# Patient Record
Sex: Female | Born: 1985
Health system: Southern US, Community
[De-identification: ages and names within clinical notes are randomized; demographics above are authoritative.]

## PROBLEM LIST (undated history)

## (undated) ENCOUNTER — Inpatient Hospital Stay (HOSPITAL_COMMUNITY): Payer: Self-pay

## (undated) DIAGNOSIS — O34219 Maternal care for unspecified type scar from previous cesarean delivery: Secondary | ICD-10-CM

## (undated) DIAGNOSIS — R51 Headache: Secondary | ICD-10-CM

## (undated) DIAGNOSIS — Z8619 Personal history of other infectious and parasitic diseases: Secondary | ICD-10-CM

## (undated) HISTORY — PX: OTHER SURGICAL HISTORY: SHX169

## (undated) HISTORY — DX: Personal history of other infectious and parasitic diseases: Z86.19

## (undated) HISTORY — PX: MOUTH SURGERY: SHX715

## (undated) HISTORY — DX: Headache: R51

---

## 2012-10-30 LAB — OB RESULTS CONSOLE RPR: RPR: NONREACTIVE

## 2012-10-30 LAB — OB RESULTS CONSOLE ABO/RH: RH Type: POSITIVE

## 2012-10-30 LAB — OB RESULTS CONSOLE HIV ANTIBODY (ROUTINE TESTING): HIV: NONREACTIVE

## 2012-10-30 LAB — OB RESULTS CONSOLE HEPATITIS B SURFACE ANTIGEN: Hepatitis B Surface Ag: NEGATIVE

## 2013-06-02 ENCOUNTER — Encounter (HOSPITAL_COMMUNITY): Payer: Self-pay | Admitting: *Deleted

## 2013-06-02 ENCOUNTER — Telehealth (HOSPITAL_COMMUNITY): Payer: Self-pay | Admitting: *Deleted

## 2013-06-02 NOTE — Telephone Encounter (Signed)
Preadmission screen  

## 2013-06-05 ENCOUNTER — Inpatient Hospital Stay (HOSPITAL_COMMUNITY): Admission: RE | Admit: 2013-06-05 | Payer: BC Managed Care – PPO | Source: Ambulatory Visit

## 2013-06-07 ENCOUNTER — Encounter (HOSPITAL_COMMUNITY): Payer: Self-pay | Admitting: Anesthesiology

## 2013-06-07 ENCOUNTER — Inpatient Hospital Stay (HOSPITAL_COMMUNITY): Payer: BC Managed Care – PPO | Admitting: Anesthesiology

## 2013-06-07 ENCOUNTER — Encounter (HOSPITAL_COMMUNITY): Payer: Self-pay

## 2013-06-07 ENCOUNTER — Inpatient Hospital Stay (HOSPITAL_COMMUNITY)
Admission: RE | Admit: 2013-06-07 | Discharge: 2013-06-10 | DRG: 371 | Disposition: A | Discharge status: Home / Self Care | Payer: BC Managed Care – PPO | Source: Ambulatory Visit | Attending: Obstetrics and Gynecology | Admitting: Obstetrics and Gynecology

## 2013-06-07 DIAGNOSIS — O99892 Other specified diseases and conditions complicating childbirth: Secondary | ICD-10-CM | POA: Diagnosis present

## 2013-06-07 DIAGNOSIS — O409XX0 Polyhydramnios, unspecified trimester, not applicable or unspecified: Secondary | ICD-10-CM | POA: Diagnosis present

## 2013-06-07 DIAGNOSIS — IMO0002 Reserved for concepts with insufficient information to code with codable children: Secondary | ICD-10-CM | POA: Diagnosis present

## 2013-06-07 DIAGNOSIS — O330 Maternal care for disproportion due to deformity of maternal pelvic bones: Secondary | ICD-10-CM | POA: Diagnosis not present

## 2013-06-07 DIAGNOSIS — O339 Maternal care for disproportion, unspecified: Secondary | ICD-10-CM | POA: Diagnosis not present

## 2013-06-07 DIAGNOSIS — O3660X0 Maternal care for excessive fetal growth, unspecified trimester, not applicable or unspecified: Secondary | ICD-10-CM | POA: Diagnosis present

## 2013-06-07 DIAGNOSIS — Z22330 Carrier of Group B streptococcus: Secondary | ICD-10-CM

## 2013-06-07 DIAGNOSIS — O34599 Maternal care for other abnormalities of gravid uterus, unspecified trimester: Secondary | ICD-10-CM | POA: Diagnosis present

## 2013-06-07 LAB — CBC
HCT: 42.3 % (ref 36.0–46.0)
HCT: 40.4 % (ref 36.0–46.0)
Hemoglobin: 14.6 g/dL (ref 12.0–15.0)
MCH: 31.9 pg (ref 26.0–34.0)
MCH: 32 pg (ref 26.0–34.0)
MCHC: 34.5 g/dL (ref 30.0–36.0)
MCV: 91.6 fL (ref 78.0–100.0)
Platelets: 172 10*3/uL (ref 150–400)
RDW: 13.7 % (ref 11.5–15.5)
RDW: 13.7 % (ref 11.5–15.5)

## 2013-06-07 LAB — COMPREHENSIVE METABOLIC PANEL
ALT: 9 U/L (ref 0–35)
BUN: 8 mg/dL (ref 6–23)
CO2: 21 mEq/L (ref 19–32)
Calcium: 9.6 mg/dL (ref 8.4–10.5)
Creatinine, Ser: 0.75 mg/dL (ref 0.50–1.10)
Glucose, Bld: 84 mg/dL (ref 70–99)

## 2013-06-07 LAB — RPR: RPR Ser Ql: NONREACTIVE

## 2013-06-07 MED ORDER — ONDANSETRON HCL 4 MG/2ML IJ SOLN
4.0000 mg | Freq: Once | INTRAMUSCULAR | Status: AC
  Administered 2013-06-07: 4 mg via INTRAVENOUS
  Filled 2013-06-07: qty 2

## 2013-06-07 MED ORDER — FAMOTIDINE IN NACL 20-0.9 MG/50ML-% IV SOLN
20.0000 mg | Freq: Once | INTRAVENOUS | Status: AC
  Administered 2013-06-08: 20 mg via INTRAVENOUS
  Filled 2013-06-07: qty 50

## 2013-06-07 MED ORDER — DIPHENHYDRAMINE HCL 50 MG/ML IJ SOLN: 12.5000 mg | INTRAMUSCULAR | Status: DC | PRN

## 2013-06-07 MED ORDER — MISOPROSTOL 25 MCG QUARTER TABLET
25.0000 ug | ORAL_TABLET | Freq: Once | ORAL | Status: AC
  Administered 2013-06-07: 25 ug via VAGINAL
  Filled 2013-06-07: qty 0.25

## 2013-06-07 MED ORDER — PENICILLIN G POTASSIUM 5000000 UNITS IJ SOLR
2.5000 10*6.[IU] | INTRAVENOUS | Status: DC
  Administered 2013-06-07: 2.5 10*6.[IU] via INTRAVENOUS
  Filled 2013-06-07 (×8): qty 2.5

## 2013-06-07 MED ORDER — FENTANYL 2.5 MCG/ML BUPIVACAINE 1/10 % EPIDURAL INFUSION (WH - ANES)
14.0000 mL/h | INTRAMUSCULAR | Status: DC | PRN
  Administered 2013-06-07: 14 mL/h via EPIDURAL
  Filled 2013-06-07: qty 125

## 2013-06-07 MED ORDER — LIDOCAINE HCL (PF) 1 % IJ SOLN: 30.0000 mL | INTRAMUSCULAR | Status: DC | PRN

## 2013-06-07 MED ORDER — FENTANYL CITRATE 0.05 MG/ML IJ SOLN
50.0000 ug | Freq: Once | INTRAMUSCULAR | Status: AC
  Administered 2013-06-07: 50 ug via INTRAVENOUS
  Filled 2013-06-07: qty 2

## 2013-06-07 MED ORDER — EPHEDRINE 5 MG/ML INJ
10.0000 mg | INTRAVENOUS | Status: DC | PRN
  Filled 2013-06-07: qty 4

## 2013-06-07 MED ORDER — LACTATED RINGERS IV SOLN: 500.0000 mL | Freq: Once | INTRAVENOUS | Status: DC

## 2013-06-07 MED ORDER — FENTANYL CITRATE 0.05 MG/ML IJ SOLN
INTRAMUSCULAR | Status: AC
  Filled 2013-06-07: qty 2

## 2013-06-07 MED ORDER — PHENYLEPHRINE 40 MCG/ML (10ML) SYRINGE FOR IV PUSH (FOR BLOOD PRESSURE SUPPORT): 80.0000 ug | PREFILLED_SYRINGE | INTRAVENOUS | Status: DC | PRN

## 2013-06-07 MED ORDER — EPHEDRINE 5 MG/ML INJ: 10.0000 mg | INTRAVENOUS | Status: DC | PRN

## 2013-06-07 MED ORDER — CITRIC ACID-SODIUM CITRATE 334-500 MG/5ML PO SOLN
30.0000 mL | ORAL | Status: DC | PRN
  Administered 2013-06-08 (×2): 30 mL via ORAL
  Filled 2013-06-07 (×2): qty 15

## 2013-06-07 MED ORDER — IBUPROFEN 600 MG PO TABS: 600.0000 mg | ORAL_TABLET | Freq: Four times a day (QID) | ORAL | Status: DC | PRN

## 2013-06-07 MED ORDER — PENICILLIN G POTASSIUM 5000000 UNITS IJ SOLR
5.0000 10*6.[IU] | Freq: Once | INTRAMUSCULAR | Status: AC
  Administered 2013-06-07: 5 10*6.[IU] via INTRAVENOUS
  Filled 2013-06-07: qty 5

## 2013-06-07 MED ORDER — LIDOCAINE HCL (PF) 1 % IJ SOLN
INTRAMUSCULAR | Status: DC | PRN
  Administered 2013-06-07 (×2): 5 mL

## 2013-06-07 MED ORDER — LACTATED RINGERS IV SOLN
500.0000 mL | INTRAVENOUS | Status: DC | PRN
  Administered 2013-06-07 (×2): 1000 mL via INTRAVENOUS

## 2013-06-07 MED ORDER — PHENYLEPHRINE 40 MCG/ML (10ML) SYRINGE FOR IV PUSH (FOR BLOOD PRESSURE SUPPORT)
80.0000 ug | PREFILLED_SYRINGE | INTRAVENOUS | Status: AC | PRN
  Administered 2013-06-08: 40 ug via INTRAVENOUS
  Administered 2013-06-08 (×2): 80 ug via INTRAVENOUS
  Administered 2013-06-08: 40 ug via INTRAVENOUS
  Administered 2013-06-08: 120 ug via INTRAVENOUS
  Administered 2013-06-08 (×3): 80 ug via INTRAVENOUS
  Filled 2013-06-07: qty 5

## 2013-06-07 MED ORDER — TERBUTALINE SULFATE 1 MG/ML IJ SOLN: 0.2500 mg | Freq: Once | INTRAMUSCULAR | Status: AC | PRN

## 2013-06-07 MED ORDER — FENTANYL CITRATE 0.05 MG/ML IJ SOLN
50.0000 ug | INTRAMUSCULAR | Status: DC | PRN
  Administered 2013-06-07 – 2013-06-08 (×2): 50 ug via INTRAVENOUS

## 2013-06-07 MED ORDER — OXYTOCIN 10 UNIT/ML IJ SOLN: 10.0000 [IU] | Freq: Once | INTRAMUSCULAR | Status: DC

## 2013-06-07 MED ORDER — OXYCODONE-ACETAMINOPHEN 5-325 MG PO TABS: 1.0000 | ORAL_TABLET | ORAL | Status: DC | PRN

## 2013-06-07 NOTE — Progress Notes (Signed)
Subjective: pain med wearing off, uncomfortable, breathing with ctx  Objective:   VS: Blood pressure 134/96, pulse 86, temperature 98.2 F (36.8 C), temperature source Oral, resp. rate 18, height 5\' 6"  (1.676 m), weight 93.895 kg (207 lb). FHR : baseline 140 / variability moderate / accelerations + /  Decelerations - Toco: contractions every 1-5 minutes Cervix : 5/80/-2 Membranes: bulging  Assessment:  Active first stage of labor FHR category I Polyhydramnios LGA +GBS Elevated BP's; r/o Preeclampsia  Plan:  Cervical bulb out Analgesia prn GBS prophylaxis CMP    Shannara Winbush, N MSN, CNM 06/07/2013, 7:06 PM

## 2013-06-07 NOTE — Progress Notes (Signed)
S:  Comfortable - no pain or pressure  O:  VS: Blood pressure 123/73, pulse 93, temperature 98.6 F (37 C), temperature source Oral, resp. rate 18, height 5\' 6"  (1.676 m), weight 93.895 kg (207 lb).        FHR : baseline 155 / variability moderate / accelerations + / variable deceleration occasional                   Single prolonged decels ~ an hour ago - provider not notified by staff        Toco: contractions every 1-3 minutes / moderate to strong / MVU adequate         Cervix : 7cm / 90% / -2 caput        Membranes: clear fluid with small show  A: active labor with slow progression and absent descent despite adequate ctx     FHR category 1-2  P: discussed with patient and spouse - no progress or descent      will recheck in 2 hours then consult for CS if no progression by that time      Plan CS if any further prolonged decelerations or the development of category 3 tracing       Dr Juliene Pina updated    Rose Ellis CNM, MSN, [facility] 06/07/2013, 10:57 PM

## 2013-06-07 NOTE — Progress Notes (Signed)
Report received  - care assumed  S:  Some pain with ctx but less since epidural       Water broke at 1900 while up to BR  O:  VS: Blood pressure 115/82, pulse 85, temperature 98.2 F (36.8 C), temperature source Oral, resp. rate 18, height 5\' 6"  (1.676 m), weight 93.895 kg (207 lb).        FHR : baseline 150 / variability moderate / accelerations none / occasional mild variable decelerations        Toco: contractions every 1-2 minutes / moderate to strong         Cervix : 7cm / 90% / vtx / -2        Membranes: SROM @ 1900 with clear fluid - large amount        IUPC placed / foley placed  A: Active labor     FHR category 1  P: active management - induction of labor      Recheck in 2 hours   Marlinda Mike CNM, MSN, [facility] 06/07/2013, 8:29 PM

## 2013-06-07 NOTE — Anesthesia Procedure Notes (Signed)

## 2013-06-07 NOTE — Anesthesia Preprocedure Evaluation (Signed)

## 2013-06-07 NOTE — Progress Notes (Signed)
Subjective:   Beginning to breath through ctx, some worse than others, requesting pain meds  Objective:   VS: Blood pressure 137/91, pulse 85, temperature 98.2 F (36.8 C), temperature source Oral, resp. rate 18, height 5\' 6"  (1.676 m), weight 93.895 kg (207 lb). FHR : baseline 135 / variability moderate / accelerations + / + decelerations (few variables) Toco: contractions every 1-5 minutes /  Cervix : 1.5/60/-3 Membranes: intact  Assessment:  First stage labor, latent FHR category II +GBS Polyhydramnios LGA  Plan:  Cervical bulb placed Cytotec PV Analgesia prn Reposition laterally    Huriel Matt, N MSN, CNM 06/07/2013, 5:18 PM

## 2013-06-07 NOTE — Progress Notes (Signed)
Subjective: starting to feel some ctx, not uncomfortable, declines pain meds  Objective:   VS: Blood pressure 131/80, pulse 90, temperature 97.5 F (36.4 C), temperature source Oral, resp. rate 18, height 5\' 6"  (1.676 m), weight 93.895 kg (207 lb). FHR : baseline 130 / variability moderate / accelerations + /  Decelerations - Toco: contractions every 2-4 minutes  Cervix : 1/60/-3, posterior, ballotable Membranes: intact  Assessment:  Latent first stage labor FHR category I Polyhydramnios LGA +GBS  Plan:  Next dose of Cytotec when ctx space out Foley bulb when lower station/more dilated GBS prophylaxis when more active labor     Donette Larry, N MSN, CNM 06/07/2013, 1:20 PM

## 2013-06-07 NOTE — H&P (Signed)
  OB ADMISSION/ HISTORY & PHYSICAL:  Admission Date: 06/07/2013  7:11 AM  Admit Diagnosis: Polyhydramnios, LGA  Rose Ellis is a 27 y.o. female presenting for IOL secondary to polyhydramnios 24.7, and LGA; 92%, EFW 8-15.  Prenatal History: G1P0   EDC : 06/08/2013, by Other Basis  Prenatal care at [facility]  Primary Ob Provider: Marlinda Mike Prenatal course complicated by polyhydramnios, LGA  Prenatal Labs: ABO, Rh:A POS Antibody: Negative (12/20 0000) Rubella: Immune (12/20 0000)  RPR: Nonreactive (12/20 0000)  HBsAg: Negative (12/20 0000)  HIV: Non-reactive (12/20 0000)  GBS:  POS 1 hr GTT : WNL  Medical / Surgical History :  Past medical history:  Past Medical History  Diagnosis Date  . Headache(784.0)   . Hx of varicella      Past surgical history:  Past Surgical History  Procedure Laterality Date  . Mouth surgery      x2  . Spleen laceration      2009    Family History:  Family History  Problem Relation Age of Onset  . Cancer Paternal Grandmother     lung  . Cancer Paternal Grandfather     lung  . Congenital heart disease Cousin   . Cancer Maternal Grandfather     lung     Social History:  reports that she has never smoked. She has never used smokeless tobacco. She reports that she does not drink alcohol or use illicit drugs.  Allergies: Robaxin   Current Medications at time of admission:  Prior to Admission medications   Medication Sig Start Date End Date Taking? Authorizing Provider  calcium carbonate (TUMS - DOSED IN MG ELEMENTAL CALCIUM) 500 MG chewable tablet Chew 1 tablet by mouth daily.   Yes Historical Provider, MD  Docosahexaenoic Acid (DHA PO) Take 1 tablet by mouth daily.   Yes Historical Provider, MD  EVENING PRIMROSE OIL PO 1 capsule by Other route 2 (two) times daily. Take 1 capsule in the morning and inserts 1 capsule vaginally at bedtime   Yes Historical Provider, MD  Prenatal Vit-Fe Fumarate-FA (PRENATAL  MULTIVITAMIN) TABS Take 1 tablet by mouth daily at 12 noon.   Yes Historical Provider, MD     Review of Systems: +FM, no LOF or VB, irregular mild ctx    Physical Exam:  VS: Blood pressure 136/90, pulse 123, temperature 98.3 F (36.8 C), temperature source Oral, resp. rate 18, height 5\' 6"  (1.676 m), weight 93.895 kg (207 lb).  General: alert and oriented, mildly anxious Heart: RRR Lungs: Clear lung fields Abdomen: Gravid, soft and non-tender, non-distended / uterus: gravid, EFW 9lbs Extremities: negative edema  Genitalia / VE: Dilation: 1 Effacement (%): 60 Station: -3 Exam by:: Mbhambri,CNM  FHR: baseline rate 140 / variability moderate / accelerations + / decelerations negative TOCO: 2-5 min  Assessment: 39.[redacted] weeks gestation Polyhydramnios LGA +GBS bateriuria First stage of labor FHR category I   Plan:  IOL w/ cytotec, foley bulb when more dilated GBS prophylaxis when 4cm Dr Seymour Bars notified of admission / plan of care   Donette Larry, Dorris Carnes MSN, CNM 06/07/2013, 9:08 AM

## 2013-06-08 ENCOUNTER — Inpatient Hospital Stay (HOSPITAL_COMMUNITY): Admission: AD | Admit: 2013-06-08 | Payer: Self-pay | Source: Ambulatory Visit | Admitting: Obstetrics and Gynecology

## 2013-06-08 ENCOUNTER — Encounter (HOSPITAL_COMMUNITY)
Admission: RE | Disposition: A | Discharge status: Home / Self Care | Payer: Self-pay | Source: Ambulatory Visit | Attending: Obstetrics and Gynecology

## 2013-06-08 ENCOUNTER — Encounter (HOSPITAL_COMMUNITY): Payer: Self-pay

## 2013-06-08 LAB — CBC
MCHC: 34 g/dL (ref 30.0–36.0)
RDW: 13.7 % (ref 11.5–15.5)

## 2013-06-08 SURGERY — Surgical Case
Anesthesia: Regional | Site: Abdomen | Wound class: Clean Contaminated

## 2013-06-08 SURGERY — Surgical Case
Anesthesia: Regional

## 2013-06-08 MED ORDER — PHENYLEPHRINE 40 MCG/ML (10ML) SYRINGE FOR IV PUSH (FOR BLOOD PRESSURE SUPPORT)
PREFILLED_SYRINGE | INTRAVENOUS | Status: AC
  Filled 2013-06-08: qty 10

## 2013-06-08 MED ORDER — ONDANSETRON HCL 4 MG PO TABS: 4.0000 mg | ORAL_TABLET | ORAL | Status: DC | PRN

## 2013-06-08 MED ORDER — DIBUCAINE 1 % RE OINT: 1.0000 "application " | TOPICAL_OINTMENT | RECTAL | Status: DC | PRN

## 2013-06-08 MED ORDER — MIDAZOLAM HCL 2 MG/2ML IJ SOLN: 0.5000 mg | Freq: Once | INTRAMUSCULAR | Status: DC | PRN

## 2013-06-08 MED ORDER — DIPHENHYDRAMINE HCL 25 MG PO CAPS: 25.0000 mg | ORAL_CAPSULE | Freq: Four times a day (QID) | ORAL | Status: DC | PRN

## 2013-06-08 MED ORDER — SIMETHICONE 80 MG PO CHEW
80.0000 mg | CHEWABLE_TABLET | ORAL | Status: DC | PRN
  Administered 2013-06-09: 80 mg via ORAL

## 2013-06-08 MED ORDER — LACTATED RINGERS IV SOLN
INTRAVENOUS | Status: DC
  Administered 2013-06-08 (×2): via INTRAVENOUS

## 2013-06-08 MED ORDER — OXYTOCIN 10 UNIT/ML IJ SOLN
INTRAMUSCULAR | Status: AC
  Filled 2013-06-08: qty 4

## 2013-06-08 MED ORDER — MEPERIDINE HCL 25 MG/ML IJ SOLN
INTRAMUSCULAR | Status: AC
  Filled 2013-06-08: qty 1

## 2013-06-08 MED ORDER — MORPHINE SULFATE (PF) 0.5 MG/ML IJ SOLN
INTRAMUSCULAR | Status: DC | PRN
  Administered 2013-06-08: 1 mg via EPIDURAL

## 2013-06-08 MED ORDER — NALOXONE HCL 0.4 MG/ML IJ SOLN: 0.4000 mg | INTRAMUSCULAR | Status: DC | PRN

## 2013-06-08 MED ORDER — METOCLOPRAMIDE HCL 5 MG/ML IJ SOLN
INTRAMUSCULAR | Status: AC
  Filled 2013-06-08: qty 2

## 2013-06-08 MED ORDER — LANOLIN HYDROUS EX OINT: 1.0000 "application " | TOPICAL_OINTMENT | CUTANEOUS | Status: DC | PRN

## 2013-06-08 MED ORDER — 0.9 % SODIUM CHLORIDE (POUR BTL) OPTIME
TOPICAL | Status: DC | PRN
  Administered 2013-06-08: 1000 mL

## 2013-06-08 MED ORDER — OXYTOCIN 10 UNIT/ML IJ SOLN
40.0000 [IU] | INTRAVENOUS | Status: DC | PRN
  Administered 2013-06-08: 40 [IU] via INTRAVENOUS

## 2013-06-08 MED ORDER — PROMETHAZINE HCL 25 MG/ML IJ SOLN: 6.2500 mg | INTRAMUSCULAR | Status: DC | PRN

## 2013-06-08 MED ORDER — NALBUPHINE HCL 10 MG/ML IJ SOLN
5.0000 mg | INTRAMUSCULAR | Status: DC | PRN
  Filled 2013-06-08: qty 1

## 2013-06-08 MED ORDER — SODIUM CHLORIDE 0.9 % IJ SOLN: 3.0000 mL | INTRAMUSCULAR | Status: DC | PRN

## 2013-06-08 MED ORDER — KETOROLAC TROMETHAMINE 30 MG/ML IJ SOLN
30.0000 mg | Freq: Four times a day (QID) | INTRAMUSCULAR | Status: DC | PRN
  Administered 2013-06-08: 30 mg via INTRAVENOUS
  Filled 2013-06-08: qty 1

## 2013-06-08 MED ORDER — DEXTROSE 5 % IV SOLN
1.0000 ug/kg/h | INTRAVENOUS | Status: DC | PRN
  Filled 2013-06-08: qty 2

## 2013-06-08 MED ORDER — DIPHENHYDRAMINE HCL 25 MG PO CAPS: 25.0000 mg | ORAL_CAPSULE | ORAL | Status: DC | PRN

## 2013-06-08 MED ORDER — MEPERIDINE HCL 25 MG/ML IJ SOLN: 6.2500 mg | INTRAMUSCULAR | Status: DC | PRN

## 2013-06-08 MED ORDER — KETOROLAC TROMETHAMINE 30 MG/ML IJ SOLN: 30.0000 mg | Freq: Four times a day (QID) | INTRAMUSCULAR | Status: DC | PRN

## 2013-06-08 MED ORDER — ONDANSETRON HCL 4 MG/2ML IJ SOLN: 4.0000 mg | Freq: Three times a day (TID) | INTRAMUSCULAR | Status: DC | PRN

## 2013-06-08 MED ORDER — SODIUM BICARBONATE 8.4 % IV SOLN
INTRAVENOUS | Status: DC | PRN
  Administered 2013-06-08: 6 mL via EPIDURAL

## 2013-06-08 MED ORDER — CEFAZOLIN SODIUM-DEXTROSE 2-3 GM-% IV SOLR
INTRAVENOUS | Status: DC | PRN
  Administered 2013-06-08: 2 g via INTRAVENOUS

## 2013-06-08 MED ORDER — OXYCODONE-ACETAMINOPHEN 5-325 MG PO TABS
1.0000 | ORAL_TABLET | ORAL | Status: DC | PRN
  Administered 2013-06-09 – 2013-06-10 (×7): 1 via ORAL
  Filled 2013-06-08 (×7): qty 1

## 2013-06-08 MED ORDER — METOCLOPRAMIDE HCL 5 MG/ML IJ SOLN: 10.0000 mg | Freq: Three times a day (TID) | INTRAMUSCULAR | Status: DC | PRN

## 2013-06-08 MED ORDER — MENTHOL 3 MG MT LOZG: 1.0000 | LOZENGE | OROMUCOSAL | Status: DC | PRN

## 2013-06-08 MED ORDER — DIPHENHYDRAMINE HCL 50 MG/ML IJ SOLN: 25.0000 mg | INTRAMUSCULAR | Status: DC | PRN

## 2013-06-08 MED ORDER — PRENATAL MULTIVITAMIN CH
1.0000 | ORAL_TABLET | Freq: Every day | ORAL | Status: DC
  Administered 2013-06-08 – 2013-06-10 (×3): 1 via ORAL
  Filled 2013-06-08 (×3): qty 1

## 2013-06-08 MED ORDER — LIDOCAINE-EPINEPHRINE 2 %-1:100000 IJ SOLN
INTRAMUSCULAR | Status: DC | PRN
  Administered 2013-06-08: 5 mL via INTRADERMAL

## 2013-06-08 MED ORDER — FENTANYL CITRATE 0.05 MG/ML IJ SOLN
INTRAMUSCULAR | Status: AC
  Filled 2013-06-08: qty 2

## 2013-06-08 MED ORDER — SIMETHICONE 80 MG PO CHEW
80.0000 mg | CHEWABLE_TABLET | Freq: Three times a day (TID) | ORAL | Status: DC
  Administered 2013-06-08 – 2013-06-10 (×7): 80 mg via ORAL

## 2013-06-08 MED ORDER — LACTATED RINGERS IV SOLN
INTRAVENOUS | Status: DC | PRN
  Administered 2013-06-08 (×3): via INTRAVENOUS

## 2013-06-08 MED ORDER — ONDANSETRON HCL 4 MG/2ML IJ SOLN
INTRAMUSCULAR | Status: DC | PRN
  Administered 2013-06-08: 4 mg via INTRAVENOUS

## 2013-06-08 MED ORDER — WITCH HAZEL-GLYCERIN EX PADS: 1.0000 "application " | MEDICATED_PAD | CUTANEOUS | Status: DC | PRN

## 2013-06-08 MED ORDER — SCOPOLAMINE 1 MG/3DAYS TD PT72
1.0000 | MEDICATED_PATCH | Freq: Once | TRANSDERMAL | Status: DC
  Administered 2013-06-08: 1.5 mg via TRANSDERMAL

## 2013-06-08 MED ORDER — DIPHENHYDRAMINE HCL 50 MG/ML IJ SOLN
INTRAMUSCULAR | Status: DC | PRN
  Administered 2013-06-08: 25 mg via INTRAVENOUS

## 2013-06-08 MED ORDER — OXYTOCIN 40 UNITS IN LACTATED RINGERS INFUSION - SIMPLE MED: 62.5000 mL/h | INTRAVENOUS | Status: AC

## 2013-06-08 MED ORDER — ONDANSETRON HCL 4 MG/2ML IJ SOLN
INTRAMUSCULAR | Status: AC
  Filled 2013-06-08: qty 2

## 2013-06-08 MED ORDER — TETANUS-DIPHTH-ACELL PERTUSSIS 5-2.5-18.5 LF-MCG/0.5 IM SUSP
0.5000 mL | Freq: Once | INTRAMUSCULAR | Status: AC
  Administered 2013-06-08: 0.5 mL via INTRAMUSCULAR

## 2013-06-08 MED ORDER — DIPHENHYDRAMINE HCL 50 MG/ML IJ SOLN: 12.5000 mg | INTRAMUSCULAR | Status: DC | PRN

## 2013-06-08 MED ORDER — CEFAZOLIN SODIUM-DEXTROSE 2-3 GM-% IV SOLR
2.0000 g | INTRAVENOUS | Status: AC
  Administered 2013-06-08: 2 g via INTRAVENOUS
  Filled 2013-06-08: qty 50

## 2013-06-08 MED ORDER — MORPHINE SULFATE 0.5 MG/ML IJ SOLN
INTRAMUSCULAR | Status: AC
  Filled 2013-06-08: qty 10

## 2013-06-08 MED ORDER — MORPHINE SULFATE (PF) 0.5 MG/ML IJ SOLN
INTRAMUSCULAR | Status: DC | PRN
  Administered 2013-06-08: 4 mg via EPIDURAL

## 2013-06-08 MED ORDER — ONDANSETRON HCL 4 MG/2ML IJ SOLN: 4.0000 mg | INTRAMUSCULAR | Status: DC | PRN

## 2013-06-08 MED ORDER — METOCLOPRAMIDE HCL 5 MG/ML IJ SOLN
INTRAMUSCULAR | Status: DC | PRN
  Administered 2013-06-08: 10 mg via INTRAVENOUS

## 2013-06-08 MED ORDER — SCOPOLAMINE 1 MG/3DAYS TD PT72
MEDICATED_PATCH | TRANSDERMAL | Status: AC
  Filled 2013-06-08: qty 1

## 2013-06-08 MED ORDER — ZOLPIDEM TARTRATE 5 MG PO TABS: 5.0000 mg | ORAL_TABLET | Freq: Every evening | ORAL | Status: DC | PRN

## 2013-06-08 MED ORDER — SENNOSIDES-DOCUSATE SODIUM 8.6-50 MG PO TABS
2.0000 | ORAL_TABLET | Freq: Every day | ORAL | Status: DC
  Administered 2013-06-09: 2 via ORAL

## 2013-06-08 MED ORDER — IBUPROFEN 600 MG PO TABS
600.0000 mg | ORAL_TABLET | Freq: Four times a day (QID) | ORAL | Status: DC
  Administered 2013-06-08 – 2013-06-10 (×8): 600 mg via ORAL
  Filled 2013-06-08 (×8): qty 1

## 2013-06-08 MED ORDER — MEPERIDINE HCL 25 MG/ML IJ SOLN
INTRAMUSCULAR | Status: DC | PRN
  Administered 2013-06-08 (×2): 12.5 mg via INTRAVENOUS

## 2013-06-08 MED ORDER — LACTATED RINGERS IV SOLN: INTRAVENOUS | Status: DC

## 2013-06-08 MED ORDER — KETOROLAC TROMETHAMINE 30 MG/ML IJ SOLN
INTRAMUSCULAR | Status: AC
  Administered 2013-06-08: 30 mg
  Filled 2013-06-08: qty 1

## 2013-06-08 MED ORDER — ACETAMINOPHEN 10 MG/ML IV SOLN
1000.0000 mg | Freq: Four times a day (QID) | INTRAVENOUS | Status: AC | PRN
  Filled 2013-06-08: qty 100

## 2013-06-08 MED ORDER — LACTATED RINGERS IV BOLUS (SEPSIS)
500.0000 mL | Freq: Once | INTRAVENOUS | Status: AC
  Administered 2013-06-08: 500 mL via INTRAVENOUS

## 2013-06-08 MED ORDER — FENTANYL CITRATE 0.05 MG/ML IJ SOLN
25.0000 ug | INTRAMUSCULAR | Status: DC | PRN
  Administered 2013-06-08: 50 ug via INTRAVENOUS

## 2013-06-08 MED ORDER — DIPHENHYDRAMINE HCL 50 MG/ML IJ SOLN
INTRAMUSCULAR | Status: AC
Start: 2013-06-08 — End: 2013-06-08
  Filled 2013-06-08: qty 1

## 2013-06-08 SURGICAL SUPPLY — 39 items
BENZOIN TINCTURE PRP APPL 2/3 (GAUZE/BANDAGES/DRESSINGS) ×2 IMPLANT
CLAMP CORD UMBIL (MISCELLANEOUS) IMPLANT
CLOTH BEACON ORANGE TIMEOUT ST (SAFETY) ×2 IMPLANT
CONTAINER PREFILL 10% NBF 15ML (MISCELLANEOUS) IMPLANT
DRAPE LG THREE QUARTER DISP (DRAPES) ×2 IMPLANT
DRSG OPSITE POSTOP 4X10 (GAUZE/BANDAGES/DRESSINGS) ×2 IMPLANT
DURAPREP 26ML APPLICATOR (WOUND CARE) ×2 IMPLANT
ELECT REM PT RETURN 9FT ADLT (ELECTROSURGICAL) ×2
ELECTRODE REM PT RTRN 9FT ADLT (ELECTROSURGICAL) ×1 IMPLANT
EXTRACTOR VACUUM KIWI (MISCELLANEOUS) IMPLANT
EXTRACTOR VACUUM M CUP 4 TUBE (SUCTIONS) IMPLANT
GLOVE BIO SURGEON STRL SZ7 (GLOVE) ×2 IMPLANT
GLOVE BIOGEL PI IND STRL 7.0 (GLOVE) ×1 IMPLANT
GLOVE BIOGEL PI INDICATOR 7.0 (GLOVE) ×1
GOWN STRL REIN XL XLG (GOWN DISPOSABLE) ×4 IMPLANT
KIT ABG SYR 3ML LUER SLIP (SYRINGE) IMPLANT
NEEDLE HYPO 25X5/8 SAFETYGLIDE (NEEDLE) IMPLANT
NS IRRIG 1000ML POUR BTL (IV SOLUTION) ×2 IMPLANT
PACK C SECTION WH (CUSTOM PROCEDURE TRAY) ×2 IMPLANT
PAD OB MATERNITY 4.3X12.25 (PERSONAL CARE ITEMS) ×2 IMPLANT
RTRCTR C-SECT PINK 25CM LRG (MISCELLANEOUS) IMPLANT
STAPLER VISISTAT 35W (STAPLE) IMPLANT
STRIP CLOSURE SKIN 1/2X4 (GAUZE/BANDAGES/DRESSINGS) ×2 IMPLANT
STRIP CLOSURE SKIN 1/4X4 (GAUZE/BANDAGES/DRESSINGS) IMPLANT
SUT PLAIN 0 NONE (SUTURE) IMPLANT
SUT PLAIN 2 0 (SUTURE)
SUT PLAIN 2 0 XLH (SUTURE) ×2 IMPLANT
SUT PLAIN ABS 2-0 CT1 27XMFL (SUTURE) IMPLANT
SUT VIC AB 0 CT1 27 (SUTURE) ×3
SUT VIC AB 0 CT1 27XBRD ANBCTR (SUTURE) ×3 IMPLANT
SUT VIC AB 0 CTX 36 (SUTURE) ×3
SUT VIC AB 0 CTX36XBRD ANBCTRL (SUTURE) ×3 IMPLANT
SUT VIC AB 2-0 CT1 27 (SUTURE) ×3
SUT VIC AB 2-0 CT1 TAPERPNT 27 (SUTURE) ×3 IMPLANT
SUT VIC AB 4-0 KS 27 (SUTURE) ×2 IMPLANT
SUT VICRYL 0 TIES 12 18 (SUTURE) IMPLANT
TOWEL OR 17X24 6PK STRL BLUE (TOWEL DISPOSABLE) ×6 IMPLANT
TRAY FOLEY CATH 14FR (SET/KITS/TRAYS/PACK) IMPLANT
WATER STERILE IRR 1000ML POUR (IV SOLUTION) ×2 IMPLANT

## 2013-06-08 NOTE — Progress Notes (Signed)
Patient ID: Rose Ellis, female   DOB: 08-Sep-1986, 27 y.o.   MRN: [id number] Pt seen and d/w CNM Fredric Mare. IOL for polyhydramnios, suspected LGA at 40 wks. Arrest of dilatation at 7 cm. VTX, FHT category I  Risks/complications of surgery reviewed incl infection, bleeding, damage to internal organs including bladder, bowels, ureters, blood vessels, other risks from anesthesia, VTE and delayed complications of any surgery, complications in future surgery reviewed. Also discussed neonatal complications incl difficult delivery, laceration, vacuum assistance, TTN etc. Pt understands and agrees, all concerns addressed.

## 2013-06-08 NOTE — Progress Notes (Signed)
S:  General malaise with nausea - given zofran and pepcid with minimal relief       states just feels exhausted and tired  O:  VS: Blood pressure 124/91, pulse 96, temperature 98.4 F (36.9 C), temperature source Oral, resp. rate 18, height 5\' 6"  (1.676 m), weight 93.895 kg (207 lb).        FHR : baseline 150 / variability moderate / accelerations +                     occasional variable decelerations with few episodic prolonged varaiables        Toco: contractions every 1-3 minutes / moderate to strong / MVU adequate        Cervix : edematous - thickened anterior cervix / swollen down to ~4cm                       vtx -2 with increased caput and over-riding sutures        Membranes: clear fluid with show  A: arrest of active labor - CPD etiology / LGA fetus     FHR category 1  P: consult with DR Juliene Pina for CS delivery      preop to OR for cesarean section      Discussed risks and benefits of CS at this time in labor due to CPD with adequate ctx      Patient and spouse understand - agree with plan for CS at this time   Marlinda Mike CNM, MSN, [facility] 06/08/2013, 1:09 AM

## 2013-06-08 NOTE — Anesthesia Postprocedure Evaluation (Signed)
  Anesthesia Post Note  Patient: Rose Ellis  Procedure(s) Performed: Procedure(s) (LRB): CESAREAN SECTION (N/A)  Anesthesia type: Epidural  Patient location: Mother/Baby  Post pain: Pain level controlled  Post assessment: Post-op Vital signs reviewed  Last Vitals:  Filed Vitals:   06/08/13 1340  BP: 105/71  Pulse: 105  Temp: 36.8 C  Resp: 20    Post vital signs: Reviewed  Level of consciousness: awake  Complications: No apparent anesthesia complications

## 2013-06-08 NOTE — Transfer of Care (Signed)
Immediate Anesthesia Transfer of Care Note  Patient: Rose Ellis  Procedure(s) Performed: Procedure(s): CESAREAN SECTION (N/A)  Patient Location: PACU  Anesthesia Type:Epidural  Level of Consciousness: awake, alert  and oriented  Airway & Oxygen Therapy: Patient Spontanous Breathing  Post-op Assessment: Report given to PACU RN and Post -op Vital signs reviewed and stable  Post vital signs: Reviewed and stable  Complications: No apparent anesthesia complications

## 2013-06-08 NOTE — Anesthesia Postprocedure Evaluation (Signed)
  Anesthesia Post Note  Patient: Rose Ellis  Procedure(s) Performed: Procedure(s) (LRB): CESAREAN SECTION (N/A)  Anesthesia type: Epidural  Patient location: PACU  Post pain: Pain level controlled  Post assessment: Post-op Vital signs reviewed  Last Vitals:  Filed Vitals:   06/08/13 0330  BP: 131/83  Pulse: 110  Temp:   Resp: 16    Post vital signs: Reviewed  Level of consciousness: awake  Complications: No apparent anesthesia complications

## 2013-06-08 NOTE — Op Note (Signed)
Cesarean Section Procedure Note [name]  06/08/2013  Indications: Arrest of labor at 7 cm after labor induction for polyhydramnios at 40 wks  Procedure: Primary Low Transverse Cesarean section                     Excision of right adnexal mass arising from right fallopian tube.  Pre-operative Diagnosis: Arrest of labor in stage I.   Post-operative Diagnosis: Same , right adnexal mass from fallopian tube  Surgeon:  Robley Fries, MD  Assistants: Marlinda Mike, CNM   Anesthesia: epidural   Procedure Details:  The patient was seen in the Labor Room. The risks, benefits, complications, treatment options, and expected outcomes were discussed with the patient. The patient concurred with the proposed plan, giving informed consent. identified as Rose Ellis and the procedure verified as C-Section Delivery. A Time Out was held and the above information confirmed.  2 gm Ancef given. After induction of anesthesia, the patient was draped and prepped in the usual sterile manner. A Pfannenstiel Incision was made and carried down through the subcutaneous tissue to the fascia. Fascial incision was made and extended transversely. The fascia was separated from the underlying rectus tissue superiorly and inferiorly. The peritoneum was identified and entered. Peritoneal incision was extended longitudinally. Alexis-O retractor was placed securely. The utero-vesical peritoneal reflection was incised transversely and the bladder flap was bluntly freed from the lower uterine segment. A low transverse uterine incision was made. Delivered from Cephalic LOT position was a vigorous FEMALE infant with Apgar scores of 9 at one minute and 9 at five minutes. Cord ph was not sent, the umbilical cord was clamped and cut cord blood was obtained for evaluation. The placenta was removed Intact and appeared normal. The uterine outline and ovaries appeared normal, left fallopian tube normal. A 5 cm pedunculated right adnexal  mass noted from right fimbria.. The uterine incision was closed with running locked sutures of 0 Vicryl followed by a second imbricating layer. Hemostasis was observed.  Right fallopian fimbrial mass was excised while carefully protecting the tube and hemostasis was observed.  Peritoneal closure done with 2-0 Vicryl. The fascia was then reapproximated with running sutures of 0Vicryl. The subcutaneous closure was performed using 2-0plain gut. The skin was closed with 4-0 Vicryl. Sterile dressing placed.    Instrument, sponge, and needle counts were correct prior the abdominal closure and were correct at the conclusion of the case.   Findings: FEMALE infant delivered cephalic from LOT position at 1.61 am on 06/08/13. Clear copious amniotic fluid noted.  3 vessel cord, nuchal cord x4 loops. Apgars 9 and 9 at and 5 minutes. Normal ovaries. Right fallopian tube fimbrial mass noted was excised. Left tube normal. Placenta normal.    Estimated Blood Loss: 800 cc  Total IV Fluids: 2400 cc LR  Urine Output: 150 cc clear in foley  Specimens: Right fimbrial mass, cord blood   Complications: no complications  Disposition: PACU - hemodynamically stable.   Maternal Condition: stable   Baby condition / location:  nursery-stable  Attending Attestation: I performed the procedure.   Signed: Surgeon(s): Robley Fries, MD

## 2013-06-09 NOTE — Progress Notes (Signed)
Patient ID: Rose Ellis, female   DOB: 1986-05-15, 27 y.o.   MRN: [id number] Subjective: POD# 27 Information for the patient's newborn:  Rose, Ellis [[id number]]  female  / circ yes  Reports feeling better since IV 27 d/c'd and able to walk around Feeding: breast Patient reports tolerating PO.  Breast symptoms: none Pain controlled with ibuprofen (OTC) and narcotic analgesics including Percocet Denies HA/SOB/C/P/N/V/dizziness. Flatus present. She reports vaginal bleeding as normal, without clots.  She is ambulating, urinating without difficult.     Objective:   VS:  Filed Vitals:   06/08/13 1745 06/08/13 2156 06/09/13 0200 06/09/13 0700  BP: 105/71 111/77 96/51 106/67  Pulse: 86 103 90 78  Temp: 98.1 F (36.7 C) 99.1 F (37.3 C) 98.1 F (36.7 C) 97.5 F (36.4 C)  TempSrc: Oral Oral Oral Oral  Resp: 16 18 18 18   Height:      Weight:      SpO2: 96% 96% 95%      Intake/Output Summary (Last 24 hours) at 06/09/13 1646 Last data filed at 06/09/13 0100  Gross per 24 hour  Intake 1199.58 ml  Output   1250 ml  Net -50.42 ml        Recent Labs  06/07/13 1930 06/08/13 0800  WBC 14.1* 20.3*  HGB 14.1 10.4*  HCT 40.4 30.6*  PLT 172 167     Blood type: A/Positive/-- (12/20 0000)  Rubella: Immune (12/20 0000)     Physical Exam:  General: alert, cooperative and no distress CV: Regular rate and rhythm, S1S2 present or without murmur or extra heart sounds Resp: clear Abdomen: soft, nontender, normal bowel sounds Incision: Tegaderm and Honeycomb dressing in place Uterine Fundus: firm, 1FB below umbilicus, nontender Lochia: minimal Ext: edema trace and Homans sign is negative, no sign of DVT      Assessment/Plan: 27 y.o.   POD# 1.  s/p Cesarean Delivery.  Indications: cephalopelvic disproportion and failure to progress                Principal Problem:   Postpartum care following cesarean delivery (7/29) Active Problems:   Polyhydramnios   Large for  gestational age fetus  Doing well, stable.               Regular diet as tolerated Ambulate Encouraged to increase hydration Routine post-op care  Kenard Gower, MSN, CNM 06/09/2013, 4:46 PM

## 2013-06-10 MED ORDER — OXYCODONE-ACETAMINOPHEN 5-325 MG PO TABS: 1.0000 | ORAL_TABLET | ORAL | Status: DC | PRN

## 2013-06-10 MED ORDER — IBUPROFEN 600 MG PO TABS: 600.0000 mg | ORAL_TABLET | Freq: Four times a day (QID) | ORAL | Status: DC

## 2013-06-10 NOTE — Discharge Summary (Signed)
POST OPERATIVE DISCHARGE SUMMARY:  Patient ID: Rose Ellis MRN: [id number] DOB/AGE: Feb 28, 1986 27 y.o.  Admit date: 06/07/2013 Admission Diagnoses: 39.[redacted] week gestation, polyhydramnios, LGA, GBS carrier   Discharge date: [id number]    Discharge Diagnoses: S/p Primary Cesarean Section  Prenatal history: G1P1001   EDC : 06/08/2013, by Other Basis  Prenatal care at [facility]  Primary provider : Marlinda Mike Prenatal course complicated by polyhydramnios, LGA  Prenatal Labs: ABO, Rh: A (12/20 0000)  Antibody: Negative (12/20 0000) Rubella: Immune (12/20 0000)  RPR: NON REACTIVE (07/28 0800)  HBsAg: Negative (12/20 0000)  HIV: Non-reactive (12/20 0000)  GBS: Positive (12/20 0000)  GTT: WNL  Medical / Surgical History :  Past medical history:  Past Medical History  Diagnosis Date  . Headache(784.0)   . Hx of varicella     Past surgical history:  Past Surgical History  Procedure Laterality Date  . Mouth surgery      x2  . Spleen laceration      2009  . Cesarean section N/A 06/08/2013    Procedure: CESAREAN SECTION;  Surgeon: Robley Fries, MD;  Location: [facility];  Service: Obstetrics;  Laterality: N/A;    Family History:  Family History  Problem Relation Age of Onset  . Cancer Paternal Grandmother     lung  . Cancer Paternal Grandfather     lung  . Congenital heart disease Cousin   . Cancer Maternal Grandfather     lung    Social History:  reports that she has never smoked. She has never used smokeless tobacco. She reports that she does not drink alcohol or use illicit drugs.   Allergies: Robaxin    Current Medications at time of admission:  Prior to Admission medications   Medication Sig Start Date End Date Taking? Authorizing Provider  Prenatal Vit-Fe Fumarate-FA (PRENATAL MULTIVITAMIN) TABS Take 1 tablet by mouth daily at 12 noon.   Yes Historical Provider, MD  ibuprofen (ADVIL,MOTRIN) 600 MG tablet Take 1 tablet (600 mg total) by  mouth every 6 (six) hours. 06/10/13   Lawernce Pitts, CNM  oxyCODONE-acetaminophen (PERCOCET/ROXICET) 5-325 MG per tablet Take 1-2 tablets by mouth every 4 (four) hours as needed. 06/10/13   Lawernce Pitts, CNM     Intrapartum Course: IOL w/ Cytotec and cervical balloon, epidural, arrest of dilation @7cm , primary CS   Procedures: Cesarean section delivery of female newborn by Dr Juliene Pina on 06/08/13 See operative report for further details APGAR (1 MIN): 9   APGAR (5 MINS): 9     Postoperative / postpartum course: uncomplicated Physical Exam:   VSS: Temp:  [98.1 F (36.7 C)] 98.1 F (36.7 C) (07/31 0540) Pulse Rate:  [77-90] 77 (07/31 0540) Resp:  [16-18] 16 (07/31 0540) BP: (100-103)/(52-69) 100/52 mmHg (07/31 0540)  LABS:  Recent Labs  06/07/13 1930 06/08/13 0800  WBC 14.1* 20.3*  HGB 14.1 10.4*  PLT 172 167    General: alert and oriented x3 Heart: RRR Lungs: clear  Abdomen: soft and non-tender / non-distended / active BS  Extremities: 1+ edema to bilateral lower extremeties / negative Homans  Dressing: clean, dry, and intact honeycomb dressing Incision:  approximated with subcuticular suture and steri strips / no erythema / no ecchymosis / no drainage  Discharge Instructions:  Discharged Condition: good Activity: pelvic rest and postoperative restrictions x 2 weeks Diet: routine Medications: see below   Medication List    STOP taking these medications  calcium carbonate 500 MG chewable tablet  Commonly known as:  TUMS - dosed in mg elemental calcium     DHA PO     EVENING PRIMROSE OIL PO      TAKE these medications       ibuprofen 600 MG tablet  Commonly known as:  ADVIL,MOTRIN  Take 1 tablet (600 mg total) by mouth every 6 (six) hours.     oxyCODONE-acetaminophen 5-325 MG per tablet  Commonly known as:  PERCOCET/ROXICET  Take 1-2 tablets by mouth every 4 (four) hours as needed.     prenatal multivitamin Tabs  Take 1 tablet by mouth daily  at 12 noon.       Wound Care: Keep dressing clean and dry, remove in 1-2 days Clean incision with warm water and dry thoroughly Call the office for redness, swelling, or drainage from incision  Postpartum Instructions: [facility] discharge booklet - instructions reviewed Discharge to: Home  Follow up : [facility] in 6 weeks for routine postpartum visit                      Signed: Donette Larry, N MSN, CNM 06/10/2013, 10:34 AM

## 2013-06-10 NOTE — Progress Notes (Signed)
POD # 2  Subjective: Pt reports feeling good/ Pain controlled with Ibuprofen and Percocet Tolerating po/Voiding without problems/ No n/v/Flatus present Activity: ad lib Bleeding is light Newborn info:  Information for the patient's newborn:  Jaryn, Hocutt [[id number]]  female  / Circumcision: done/ Feeding: breast   Objective: VS: BP 100/52  Pulse 77  Temp(Src) 98.1 F (36.7 C) (Oral)  Resp 16  Ht 5\' 6"  (1.676 m)  Wt 93.895 kg (207 lb)  BMI 33.43 kg/m2  SpO2 95%  I&O: Intake/Output     07/30 0701 - 07/31 0700 07/31 0701 - 08/01 0700   P.O.     I.V. (mL/kg)     Total Intake(mL/kg)     Urine (mL/kg/hr)     Total Output       Net              LABS:  Recent Labs  06/07/13 1930 06/08/13 0800  WBC 14.1* 20.3*  HGB 14.1 10.4*  PLT 172 167                           Physical Exam:  General: alert and cooperative CV: Regular rate and rhythm Resp: clear Abdomen: soft, nontender, normal bowel sounds Incision: healing well, no drainage, no erythema, no swelling Uterine Fundus: firm, below umbilicus, nontender Lochia: minimal Ext: edema 1+ to bilateral lower extremeties and Homans sign is negative, no sign of DVT    Assessment: POD # 2/ G1P1001/ S/P C/Section d/t arrest of dilation Doing well and stable for discharge home  Plan: Discharge home RX's: Ibuprofen 600mg  po Q 6 hrs prn pain #30 Refill x 1 Percocet 5/325 1 - 2 tabs po every 6 hrs prn pain  #30 No refill Follow-up in 6 weeks for postpartum check-up Remove dressing in 1-2 days   Signed: Donette Larry, N, MSN, CNM7/31/2014, 10:24 AM

## 2014-09-12 ENCOUNTER — Encounter (HOSPITAL_COMMUNITY): Payer: Self-pay

## 2015-11-12 HISTORY — DX: Maternal care for unspecified type scar from previous cesarean delivery: O34.219

## 2015-11-12 NOTE — L&D Delivery Note (Signed)
[facility] from [facility], RN reporting that patient is pushing well and baby crowning and ready for delivery  Delivery Note  First Stage: Labor onset: 0635 Augmentation : Pitocin - at complete dilation to facilitate vaginal delivery Analgesia /Anesthesia intrapartum: epidural AROM at 1230 / moderate meconium stained fluid  Second Stage: Complete dilation at 1337 Onset of pushing at 1820 FHR second stage 145  Delivery of a viable female at [id number] by CNM in OA position Tight double nuchal cord Cord double clamped after cessation of pulsation, cut by Matt - FOB Cord blood sample collected   Third Stage: Placenta delivered via Tomasa BlaseSchultz intact with 3 VC @ 1840 Placenta disposition: L&D Uterine tone firm / bleeding moderate  Deep 2nd degree sulcus, vaginal, and perineal / 1st degree RT labial lacerations identified  Anesthesia for repair: epidural Repair 2-0, 2-0, 3-0 vicryl Est. Blood Loss (mL): 400  Complications: VBAC / tight double nuchal  Mom to postpartum.  Baby to Couplet care / Skin to Skin.  Newborn: Birth Weight: 7 lbs 5.3 oz  Apgar Scores: 8/9 Feeding planned: breast  [name], [name], M  MSN, CNM 08/12/2016, 7:29 PM

## 2016-01-29 LAB — OB RESULTS CONSOLE GC/CHLAMYDIA
CHLAMYDIA, DNA PROBE: NEGATIVE
GC PROBE AMP, GENITAL: NEGATIVE

## 2016-01-29 LAB — OB RESULTS CONSOLE HIV ANTIBODY (ROUTINE TESTING): HIV: NONREACTIVE

## 2016-01-29 LAB — OB RESULTS CONSOLE HEPATITIS B SURFACE ANTIGEN: Hepatitis B Surface Ag: NEGATIVE

## 2016-01-29 LAB — OB RESULTS CONSOLE ABO/RH: RH Type: POSITIVE

## 2016-01-29 LAB — OB RESULTS CONSOLE ANTIBODY SCREEN: ANTIBODY SCREEN: NEGATIVE

## 2016-01-29 LAB — OB RESULTS CONSOLE RPR: RPR: NONREACTIVE

## 2016-01-29 LAB — OB RESULTS CONSOLE RUBELLA ANTIBODY, IGM: RUBELLA: IMMUNE

## 2016-02-13 DIAGNOSIS — Z36 Encounter for antenatal screening of mother: Secondary | ICD-10-CM | POA: Diagnosis not present

## 2016-02-13 DIAGNOSIS — Z3481 Encounter for supervision of other normal pregnancy, first trimester: Secondary | ICD-10-CM | POA: Diagnosis not present

## 2016-03-21 DIAGNOSIS — Z36 Encounter for antenatal screening of mother: Secondary | ICD-10-CM | POA: Diagnosis not present

## 2016-05-02 DIAGNOSIS — M62830 Muscle spasm of back: Secondary | ICD-10-CM | POA: Diagnosis not present

## 2016-05-02 DIAGNOSIS — M9903 Segmental and somatic dysfunction of lumbar region: Secondary | ICD-10-CM | POA: Diagnosis not present

## 2016-05-02 DIAGNOSIS — M9905 Segmental and somatic dysfunction of pelvic region: Secondary | ICD-10-CM | POA: Diagnosis not present

## 2016-05-02 DIAGNOSIS — M9904 Segmental and somatic dysfunction of sacral region: Secondary | ICD-10-CM | POA: Diagnosis not present

## 2016-05-30 DIAGNOSIS — Z36 Encounter for antenatal screening of mother: Secondary | ICD-10-CM | POA: Diagnosis not present

## 2016-07-26 DIAGNOSIS — Z36 Encounter for antenatal screening of mother: Secondary | ICD-10-CM | POA: Diagnosis not present

## 2016-07-26 LAB — OB RESULTS CONSOLE GBS: STREP GROUP B AG: NEGATIVE

## 2016-08-12 ENCOUNTER — Inpatient Hospital Stay (HOSPITAL_COMMUNITY)
Admission: AD | Admit: 2016-08-12 | Discharge: 2016-08-12 | Disposition: A | Discharge status: Home / Self Care | Payer: BLUE CROSS/BLUE SHIELD | Source: Ambulatory Visit | Attending: Obstetrics and Gynecology | Admitting: Obstetrics and Gynecology

## 2016-08-12 ENCOUNTER — Inpatient Hospital Stay (HOSPITAL_COMMUNITY): Payer: BLUE CROSS/BLUE SHIELD | Admitting: Anesthesiology

## 2016-08-12 ENCOUNTER — Encounter (HOSPITAL_COMMUNITY): Payer: Self-pay

## 2016-08-12 ENCOUNTER — Inpatient Hospital Stay (HOSPITAL_COMMUNITY)
Admission: AD | Admit: 2016-08-12 | Discharge: 2016-08-13 | DRG: 775 | Disposition: A | Discharge status: Home / Self Care | Payer: BLUE CROSS/BLUE SHIELD | Source: Ambulatory Visit | Attending: Obstetrics and Gynecology | Admitting: Obstetrics and Gynecology

## 2016-08-12 DIAGNOSIS — IMO0002 Reserved for concepts with insufficient information to code with codable children: Secondary | ICD-10-CM | POA: Diagnosis present

## 2016-08-12 DIAGNOSIS — Z3A38 38 weeks gestation of pregnancy: Secondary | ICD-10-CM | POA: Diagnosis not present

## 2016-08-12 DIAGNOSIS — Z23 Encounter for immunization: Secondary | ICD-10-CM | POA: Diagnosis not present

## 2016-08-12 DIAGNOSIS — O34219 Maternal care for unspecified type scar from previous cesarean delivery: Secondary | ICD-10-CM | POA: Diagnosis not present

## 2016-08-12 DIAGNOSIS — O34211 Maternal care for low transverse scar from previous cesarean delivery: Secondary | ICD-10-CM | POA: Diagnosis not present

## 2016-08-12 LAB — RPR: RPR Ser Ql: NONREACTIVE

## 2016-08-12 LAB — TYPE AND SCREEN
ABO/RH(D): A POS
ANTIBODY SCREEN: NEGATIVE

## 2016-08-12 LAB — CBC
HEMATOCRIT: 40.3 % (ref 36.0–46.0)
HEMOGLOBIN: 14 g/dL (ref 12.0–15.0)
MCH: 32.5 pg (ref 26.0–34.0)
MCHC: 34.7 g/dL (ref 30.0–36.0)
MCV: 93.5 fL (ref 78.0–100.0)
Platelets: 206 10*3/uL (ref 150–400)
RBC: 4.31 MIL/uL (ref 3.87–5.11)
RDW: 14.5 % (ref 11.5–15.5)
WBC: 15.3 10*3/uL — ABNORMAL HIGH (ref 4.0–10.5)

## 2016-08-12 LAB — ABO/RH: ABO/RH(D): A POS

## 2016-08-12 MED ORDER — COCONUT OIL OIL
1.0000 "application " | TOPICAL_OIL | Status: DC | PRN
  Administered 2016-08-13: 1 via TOPICAL
  Filled 2016-08-12: qty 120

## 2016-08-12 MED ORDER — OXYTOCIN 40 UNITS IN LACTATED RINGERS INFUSION - SIMPLE MED
1.0000 m[IU]/min | INTRAVENOUS | Status: DC
  Administered 2016-08-12: 2 m[IU]/min via INTRAVENOUS
  Filled 2016-08-12: qty 1000

## 2016-08-12 MED ORDER — BENZOCAINE-MENTHOL 20-0.5 % EX AERO
1.0000 "application " | INHALATION_SPRAY | CUTANEOUS | Status: DC | PRN
  Filled 2016-08-12 (×2): qty 56

## 2016-08-12 MED ORDER — ZOLPIDEM TARTRATE 5 MG PO TABS: 5.0000 mg | ORAL_TABLET | Freq: Every evening | ORAL | Status: DC | PRN

## 2016-08-12 MED ORDER — LIDOCAINE HCL (PF) 1 % IJ SOLN
INTRAMUSCULAR | Status: DC | PRN
  Administered 2016-08-12: 6 mL via EPIDURAL
  Administered 2016-08-12: 7 mL via EPIDURAL

## 2016-08-12 MED ORDER — EPHEDRINE 5 MG/ML INJ
10.0000 mg | INTRAVENOUS | Status: DC | PRN
  Filled 2016-08-12: qty 4

## 2016-08-12 MED ORDER — ONDANSETRON HCL 4 MG/2ML IJ SOLN: 4.0000 mg | INTRAMUSCULAR | Status: DC | PRN

## 2016-08-12 MED ORDER — IBUPROFEN 600 MG PO TABS
600.0000 mg | ORAL_TABLET | Freq: Four times a day (QID) | ORAL | Status: DC
  Administered 2016-08-12 – 2016-08-13 (×4): 600 mg via ORAL
  Filled 2016-08-12 (×4): qty 1

## 2016-08-12 MED ORDER — FENTANYL 2.5 MCG/ML BUPIVACAINE 1/10 % EPIDURAL INFUSION (WH - ANES)
14.0000 mL/h | INTRAMUSCULAR | Status: DC | PRN
  Administered 2016-08-12 (×2): 14 mL/h via EPIDURAL
  Filled 2016-08-12 (×2): qty 125

## 2016-08-12 MED ORDER — HYDROCORTISONE 2.5 % RE CREA
TOPICAL_CREAM | Freq: Three times a day (TID) | RECTAL | Status: DC
  Administered 2016-08-12 – 2016-08-13 (×3): via RECTAL
  Filled 2016-08-12: qty 28.35

## 2016-08-12 MED ORDER — SENNOSIDES-DOCUSATE SODIUM 8.6-50 MG PO TABS
2.0000 | ORAL_TABLET | ORAL | Status: DC
  Administered 2016-08-12: 2 via ORAL
  Filled 2016-08-12: qty 2

## 2016-08-12 MED ORDER — PHENYLEPHRINE 40 MCG/ML (10ML) SYRINGE FOR IV PUSH (FOR BLOOD PRESSURE SUPPORT)
80.0000 ug | PREFILLED_SYRINGE | INTRAVENOUS | Status: DC | PRN
  Filled 2016-08-12: qty 5
  Filled 2016-08-12: qty 10

## 2016-08-12 MED ORDER — TERBUTALINE SULFATE 1 MG/ML IJ SOLN
0.2500 mg | Freq: Once | INTRAMUSCULAR | Status: DC | PRN
  Filled 2016-08-12: qty 1

## 2016-08-12 MED ORDER — OXYTOCIN BOLUS FROM INFUSION
500.0000 mL | Freq: Once | INTRAVENOUS | Status: AC
  Administered 2016-08-12: 500 mL via INTRAVENOUS

## 2016-08-12 MED ORDER — ONDANSETRON HCL 4 MG/2ML IJ SOLN
4.0000 mg | Freq: Four times a day (QID) | INTRAMUSCULAR | Status: DC | PRN
  Administered 2016-08-12: 4 mg via INTRAVENOUS
  Filled 2016-08-12: qty 2

## 2016-08-12 MED ORDER — OXYCODONE HCL 5 MG PO TABS
5.0000 mg | ORAL_TABLET | ORAL | Status: DC | PRN
  Administered 2016-08-12 – 2016-08-13 (×5): 5 mg via ORAL
  Filled 2016-08-12 (×5): qty 1

## 2016-08-12 MED ORDER — LACTATED RINGERS IV SOLN
INTRAVENOUS | Status: DC
  Administered 2016-08-12 (×2): via INTRAVENOUS

## 2016-08-12 MED ORDER — PRENATAL MULTIVITAMIN CH
1.0000 | ORAL_TABLET | Freq: Every day | ORAL | Status: DC
  Administered 2016-08-13: 1 via ORAL
  Filled 2016-08-12: qty 1

## 2016-08-12 MED ORDER — LIDOCAINE HCL (PF) 1 % IJ SOLN
30.0000 mL | INTRAMUSCULAR | Status: DC | PRN
  Filled 2016-08-12: qty 30

## 2016-08-12 MED ORDER — LACTATED RINGERS IV SOLN
500.0000 mL | INTRAVENOUS | Status: DC | PRN
Start: 2016-08-12 — End: 2016-08-12

## 2016-08-12 MED ORDER — ACETAMINOPHEN 325 MG PO TABS: 650.0000 mg | ORAL_TABLET | ORAL | Status: DC | PRN

## 2016-08-12 MED ORDER — ACETAMINOPHEN 325 MG PO TABS
650.0000 mg | ORAL_TABLET | ORAL | Status: DC | PRN
  Administered 2016-08-13 (×2): 650 mg via ORAL
  Filled 2016-08-12 (×2): qty 2

## 2016-08-12 MED ORDER — WITCH HAZEL-GLYCERIN EX PADS: 1.0000 "application " | MEDICATED_PAD | CUTANEOUS | Status: DC | PRN

## 2016-08-12 MED ORDER — OXYTOCIN 10 UNIT/ML IJ SOLN: 10.0000 [IU] | Freq: Once | INTRAMUSCULAR | Status: DC

## 2016-08-12 MED ORDER — LACTATED RINGERS IV SOLN: 500.0000 mL | Freq: Once | INTRAVENOUS | Status: DC

## 2016-08-12 MED ORDER — PHENYLEPHRINE 40 MCG/ML (10ML) SYRINGE FOR IV PUSH (FOR BLOOD PRESSURE SUPPORT)
80.0000 ug | PREFILLED_SYRINGE | INTRAVENOUS | Status: DC | PRN
  Filled 2016-08-12: qty 5

## 2016-08-12 MED ORDER — DIPHENHYDRAMINE HCL 25 MG PO CAPS: 25.0000 mg | ORAL_CAPSULE | Freq: Four times a day (QID) | ORAL | Status: DC | PRN

## 2016-08-12 MED ORDER — OXYCODONE HCL 5 MG PO TABS: 10.0000 mg | ORAL_TABLET | ORAL | Status: DC | PRN

## 2016-08-12 MED ORDER — DIPHENHYDRAMINE HCL 50 MG/ML IJ SOLN: 12.5000 mg | INTRAMUSCULAR | Status: DC | PRN

## 2016-08-12 MED ORDER — SOD CITRATE-CITRIC ACID 500-334 MG/5ML PO SOLN: 30.0000 mL | ORAL | Status: DC | PRN

## 2016-08-12 MED ORDER — TETANUS-DIPHTH-ACELL PERTUSSIS 5-2.5-18.5 LF-MCG/0.5 IM SUSP
0.5000 mL | Freq: Once | INTRAMUSCULAR | Status: AC
  Administered 2016-08-13: 0.5 mL via INTRAMUSCULAR
  Filled 2016-08-12: qty 0.5

## 2016-08-12 MED ORDER — OXYTOCIN 40 UNITS IN LACTATED RINGERS INFUSION - SIMPLE MED: 2.5000 [IU]/h | INTRAVENOUS | Status: DC

## 2016-08-12 MED ORDER — SIMETHICONE 80 MG PO CHEW: 80.0000 mg | CHEWABLE_TABLET | ORAL | Status: DC | PRN

## 2016-08-12 MED ORDER — DIBUCAINE 1 % RE OINT: 1.0000 "application " | TOPICAL_OINTMENT | RECTAL | Status: DC | PRN

## 2016-08-12 MED ORDER — ONDANSETRON HCL 4 MG PO TABS: 4.0000 mg | ORAL_TABLET | ORAL | Status: DC | PRN

## 2016-08-12 NOTE — Discharge Instructions (Signed)
Third Trimester of Pregnancy °The third trimester is from week 29 through week 42, months 7 through 9. The third trimester is a time when the fetus is growing rapidly. At the end of the ninth month, the fetus is about 20 inches in length and weighs 6-10 pounds.  °BODY CHANGES °Your body goes through many changes during pregnancy. The changes vary from woman to woman.  °· Your weight will continue to increase. You can expect to gain 25-35 pounds (11-16 kg) by the end of the pregnancy. °· You may begin to get stretch marks on your hips, abdomen, and breasts. °· You may urinate more often because the fetus is moving lower into your pelvis and pressing on your bladder. °· You may develop or continue to have heartburn as a result of your pregnancy. °· You may develop constipation because certain hormones are causing the muscles that push waste through your intestines to slow down. °· You may develop hemorrhoids or swollen, bulging veins (varicose veins). °· You may have pelvic pain because of the weight gain and pregnancy hormones relaxing your joints between the bones in your pelvis. Backaches may result from overexertion of the muscles supporting your posture. °· You may have changes in your hair. These can include thickening of your hair, rapid growth, and changes in texture. Some women also have hair loss during or after pregnancy, or hair that feels dry or thin. Your hair will most likely return to normal after your baby is born. °· Your breasts will continue to grow and be tender. A yellow discharge may leak from your breasts called colostrum. °· Your belly button may stick out. °· You may feel short of breath because of your expanding uterus. °· You may notice the fetus "dropping," or moving lower in your abdomen. °· You may have a bloody mucus discharge. This usually occurs a few days to a week before labor begins. °· Your cervix becomes thin and soft (effaced) near your due date. °WHAT TO EXPECT AT YOUR PRENATAL  EXAMS  °You will have prenatal exams every 2 weeks until week 36. Then, you will have weekly prenatal exams. During a routine prenatal visit: °· You will be weighed to make sure you and the fetus are growing normally. °· Your blood pressure is taken. °· Your abdomen will be measured to track your baby's growth. °· The fetal heartbeat will be listened to. °· Any test results from the previous visit will be discussed. °· You may have a cervical check near your due date to see if you have effaced. °At around 36 weeks, your caregiver will check your cervix. At the same time, your caregiver will also perform a test on the secretions of the vaginal tissue. This test is to determine if a type of bacteria, Group B streptococcus, is present. Your caregiver will explain this further. °Your caregiver may ask you: °· What your birth plan is. °· How you are feeling. °· If you are feeling the baby move. °· If you have had any abnormal symptoms, such as leaking fluid, bleeding, severe headaches, or abdominal cramping. °· If you are using any tobacco products, including cigarettes, chewing tobacco, and electronic cigarettes. °· If you have any questions. °Other tests or screenings that may be performed during your third trimester include: °· Blood tests that check for low iron levels (anemia). °· Fetal testing to check the health, activity level, and growth of the fetus. Testing is done if you have certain medical conditions or if   there are problems during the pregnancy. °· HIV (human immunodeficiency virus) testing. If you are at high risk, you may be screened for HIV during your third trimester of pregnancy. °FALSE LABOR °You may feel small, irregular contractions that eventually go away. These are called Braxton Hicks contractions, or false labor. Contractions may last for hours, days, or even weeks before true labor sets in. If contractions come at regular intervals, intensify, or become painful, it is best to be seen by your  caregiver.  °SIGNS OF LABOR  °· Menstrual-like cramps. °· Contractions that are 5 minutes apart or less. °· Contractions that start on the top of the uterus and spread down to the lower abdomen and back. °· A sense of increased pelvic pressure or back pain. °· A watery or bloody mucus discharge that comes from the vagina. °If you have any of these signs before the 37th week of pregnancy, call your caregiver right away. You need to go to the hospital to get checked immediately. °HOME CARE INSTRUCTIONS  °· Avoid all smoking, herbs, alcohol, and unprescribed drugs. These chemicals affect the formation and growth of the baby. °· Do not use any tobacco products, including cigarettes, chewing tobacco, and electronic cigarettes. If you need help quitting, ask your health care provider. You may receive counseling support and other resources to help you quit. °· Follow your caregiver's instructions regarding medicine use. There are medicines that are either safe or unsafe to take during pregnancy. °· Exercise only as directed by your caregiver. Experiencing uterine cramps is a good sign to stop exercising. °· Continue to eat regular, healthy meals. °· Wear a good support bra for breast tenderness. °· Do not use hot tubs, steam rooms, or saunas. °· Wear your seat belt at all times when driving. °· Avoid raw meat, uncooked cheese, cat litter boxes, and soil used by cats. These carry germs that can cause birth defects in the baby. °· Take your prenatal vitamins. °· Take 1500-2000 mg of calcium daily starting at the 20th week of pregnancy until you deliver your baby. °· Try taking a stool softener (if your caregiver approves) if you develop constipation. Eat more high-fiber foods, such as fresh vegetables or fruit and whole grains. Drink plenty of fluids to keep your urine clear or pale yellow. °· Take warm sitz baths to soothe any pain or discomfort caused by hemorrhoids. Use hemorrhoid cream if your caregiver approves. °· If  you develop varicose veins, wear support hose. Elevate your feet for 15 minutes, 3-4 times a day. Limit salt in your diet. °· Avoid heavy lifting, wear low heal shoes, and practice good posture. °· Rest a lot with your legs elevated if you have leg cramps or low back pain. °· Visit your dentist if you have not gone during your pregnancy. Use a soft toothbrush to brush your teeth and be gentle when you floss. °· A sexual relationship may be continued unless your caregiver directs you otherwise. °· Do not travel far distances unless it is absolutely necessary and only with the approval of your caregiver. °· Take prenatal classes to understand, practice, and ask questions about the labor and delivery. °· Make a trial run to the hospital. °· Pack your hospital bag. °· Prepare the baby's nursery. °· Continue to go to all your prenatal visits as directed by your caregiver. °SEEK MEDICAL CARE IF: °· You are unsure if you are in labor or if your water has broken. °· You have dizziness. °· You have   mild pelvic cramps, pelvic pressure, or nagging pain in your abdominal area. °· You have persistent nausea, vomiting, or diarrhea. °· You have a bad smelling vaginal discharge. °· You have pain with urination. °SEEK IMMEDIATE MEDICAL CARE IF:  °· You have a fever. °· You are leaking fluid from your vagina. °· You have spotting or bleeding from your vagina. °· You have severe abdominal cramping or pain. °· You have rapid weight loss or gain. °· You have shortness of breath with chest pain. °· You notice sudden or extreme swelling of your face, hands, ankles, feet, or legs. °· You have not felt your baby move in over an hour. °· You have severe headaches that do not go away with medicine. °· You have vision changes. °  °This information is not intended to replace advice given to you by your health care provider. Make sure you discuss any questions you have with your health care provider. °  °Document Released: 10/22/2001 Document  Revised: 11/18/2014 Document Reviewed: 12/29/2012 °[employer] Interactive Patient Education ©2016 [employer] °Fetal Movement Counts °Patient Name: __________________________________________________ Patient Due Date: ____________________ °Performing a fetal movement count is highly recommended in high-risk pregnancies, but it is good for every pregnant woman to do. Your health care provider may ask you to start counting fetal movements at 28 weeks of the pregnancy. Fetal movements often increase: °· After eating a full meal. °· After physical activity. °· After eating or drinking something sweet or cold. °· At rest. °Pay attention to when you feel the baby is most active. This will help you notice a pattern of your baby's sleep and wake cycles and what factors contribute to an increase in fetal movement. It is important to perform a fetal movement count at the same time each day when your baby is normally most active.  °HOW TO COUNT FETAL MOVEMENTS °1. Find a quiet and comfortable area to sit or lie down on your left side. Lying on your left side provides the best blood and oxygen circulation to your baby. °2. Write down the day and time on a sheet of paper or in a journal. °3. Start counting kicks, flutters, swishes, rolls, or jabs in a 2-hour period. You should feel at least 10 movements within 2 hours. °4. If you do not feel 10 movements in 2 hours, wait 2-3 hours and count again. Look for a change in the pattern or not enough counts in 2 hours. °SEEK MEDICAL CARE IF: °· You feel less than 10 counts in 2 hours, tried twice. °· There is no movement in over an hour. °· The pattern is changing or taking longer each day to reach 10 counts in 2 hours. °· You feel the baby is not moving as he or she usually does. °Date: ____________ Movements: ____________ Start time: ____________ Finish time: ____________  °Date: ____________ Movements: ____________ Start time: ____________ Finish time: ____________ °Date:  ____________ Movements: ____________ Start time: ____________ Finish time: ____________ °Date: ____________ Movements: ____________ Start time: ____________ Finish time: ____________ °Date: ____________ Movements: ____________ Start time: ____________ Finish time: ____________ °Date: ____________ Movements: ____________ Start time: ____________ Finish time: ____________ °Date: ____________ Movements: ____________ Start time: ____________ Finish time: ____________ °Date: ____________ Movements: ____________ Start time: ____________ Finish time: ____________  °Date: ____________ Movements: ____________ Start time: ____________ Finish time: ____________ °Date: ____________ Movements: ____________ Start time: ____________ Finish time: ____________ °Date: ____________ Movements: ____________ Start time: ____________ Finish time: ____________ °Date: ____________ Movements: ____________ Start time: ____________ Finish time: ____________ °Date:   ____________ Movements: ____________ Start time: ____________ Finish time: ____________ °Date: ____________ Movements: ____________ Start time: ____________ Finish time: ____________ °Date: ____________ Movements: ____________ Start time: ____________ Finish time: ____________  °Date: ____________ Movements: ____________ Start time: ____________ Finish time: ____________ °Date: ____________ Movements: ____________ Start time: ____________ Finish time: ____________ °Date: ____________ Movements: ____________ Start time: ____________ Finish time: ____________ °Date: ____________ Movements: ____________ Start time: ____________ Finish time: ____________ °Date: ____________ Movements: ____________ Start time: ____________ Finish time: ____________ °Date: ____________ Movements: ____________ Start time: ____________ Finish time: ____________ °Date: ____________ Movements: ____________ Start time: ____________ Finish time: ____________  °Date: ____________ Movements: ____________ Start  time: ____________ Finish time: ____________ °Date: ____________ Movements: ____________ Start time: ____________ Finish time: ____________ °Date: ____________ Movements: ____________ Start time: ____________ Finish time: ____________ °Date: ____________ Movements: ____________ Start time: ____________ Finish time: ____________ °Date: ____________ Movements: ____________ Start time: ____________ Finish time: ____________ °Date: ____________ Movements: ____________ Start time: ____________ Finish time: ____________ °Date: ____________ Movements: ____________ Start time: ____________ Finish time: ____________  °Date: ____________ Movements: ____________ Start time: ____________ Finish time: ____________ °Date: ____________ Movements: ____________ Start time: ____________ Finish time: ____________ °Date: ____________ Movements: ____________ Start time: ____________ Finish time: ____________ °Date: ____________ Movements: ____________ Start time: ____________ Finish time: ____________ °Date: ____________ Movements: ____________ Start time: ____________ Finish time: ____________ °Date: ____________ Movements: ____________ Start time: ____________ Finish time: ____________ °Date: ____________ Movements: ____________ Start time: ____________ Finish time: ____________  °Date: ____________ Movements: ____________ Start time: ____________ Finish time: ____________ °Date: ____________ Movements: ____________ Start time: ____________ Finish time: ____________ °Date: ____________ Movements: ____________ Start time: ____________ Finish time: ____________ °Date: ____________ Movements: ____________ Start time: ____________ Finish time: ____________ °Date: ____________ Movements: ____________ Start time: ____________ Finish time: ____________ °Date: ____________ Movements: ____________ Start time: ____________ Finish time: ____________ °Date: ____________ Movements: ____________ Start time: ____________ Finish time: ____________    °Date: ____________ Movements: ____________ Start time: ____________ Finish time: ____________ °Date: ____________ Movements: ____________ Start time: ____________ Finish time: ____________ °Date: ____________ Movements: ____________ Start time: ____________ Finish time: ____________ °Date: ____________ Movements: ____________ Start time: ____________ Finish time: ____________ °Date: ____________ Movements: ____________ Start time: ____________ Finish time: ____________ °Date: ____________ Movements: ____________ Start time: ____________ Finish time: ____________ °Date: ____________ Movements: ____________ Start time: ____________ Finish time: ____________  °Date: ____________ Movements: ____________ Start time: ____________ Finish time: ____________ °Date: ____________ Movements: ____________ Start time: ____________ Finish time: ____________ °Date: ____________ Movements: ____________ Start time: ____________ Finish time: ____________ °Date: ____________ Movements: ____________ Start time: ____________ Finish time: ____________ °Date: ____________ Movements: ____________ Start time: ____________ Finish time: ____________ °Date: ____________ Movements: ____________ Start time: ____________ Finish time: ____________ °  °This information is not intended to replace advice given to you by your health care provider. Make sure you discuss any questions you have with your health care provider. °  °Document Released: 11/27/2006 Document Revised: 11/18/2014 Document Reviewed: 08/24/2012 °[employer] Interactive Patient Education ©2016 [employer] °Braxton Hicks Contractions °Contractions of the uterus can occur throughout pregnancy. Contractions are not always a sign that you are in labor.  °WHAT ARE BRAXTON HICKS CONTRACTIONS?  °Contractions that occur before labor are called Braxton Hicks contractions, or false labor. Toward the end of pregnancy (32-34 weeks), these contractions can develop more often and may become more  forceful. This is not true labor because these contractions do not result in opening (dilatation) and thinning of the cervix. They are sometimes difficult to tell apart from true labor because these contractions can be forceful and people have different pain tolerances. You   should not feel embarrassed if you go to the hospital with false labor. Sometimes, the only way to tell if you are in true labor is for your health care provider to look for changes in the cervix. °If there are no prenatal problems or other health problems associated with the pregnancy, it is completely safe to be sent home with false labor and await the onset of true labor. °HOW CAN YOU TELL THE DIFFERENCE BETWEEN TRUE AND FALSE LABOR? °False Labor °· The contractions of false labor are usually shorter and not as hard as those of true labor.   °· The contractions are usually irregular.   °· The contractions are often felt in the front of the lower abdomen and in the groin.   °· The contractions may go away when you walk around or change positions while lying down.   °· The contractions get weaker and are shorter lasting as time goes on.   °· The contractions do not usually become progressively stronger, regular, and closer together as with true labor.   °True Labor °· Contractions in true labor last 30-70 seconds, become very regular, usually become more intense, and increase in frequency.   °· The contractions do not go away with walking.   °· The discomfort is usually felt in the top of the uterus and spreads to the lower abdomen and low back.   °· True labor can be determined by your health care provider with an exam. This will show that the cervix is dilating and getting thinner.   °WHAT TO REMEMBER °· Keep up with your usual exercises and follow other instructions given by your health care provider.   °· Take medicines as directed by your health care provider.   °· Keep your regular prenatal appointments.   °· Eat and drink lightly if you  think you are going into labor.   °· If Braxton Hicks contractions are making you uncomfortable:   °¨ Change your position from lying down or resting to walking, or from walking to resting.   °¨ Sit and rest in a tub of warm water.   °¨ Drink 2-3 glasses of water. Dehydration may cause these contractions.   °¨ Do slow and deep breathing several times an hour.   °WHEN SHOULD I SEEK IMMEDIATE MEDICAL CARE? °Seek immediate medical care if: °· Your contractions become stronger, more regular, and closer together.   °· You have fluid leaking or gushing from your vagina.   °· You have a fever.   °· You pass blood-tinged mucus.   °· You have vaginal bleeding.   °· You have continuous abdominal pain.   °· You have low back pain that you never had before.   °· You feel your baby's head pushing down and causing pelvic pressure.   °· Your baby is not moving as much as it used to.   °  °This information is not intended to replace advice given to you by your health care provider. Make sure you discuss any questions you have with your health care provider. °  °Document Released: 10/28/2005 Document Revised: 11/02/2013 Document Reviewed: 08/09/2013 °[employer]vier Interactive Patient Education ©2016 [employer] ° °

## 2016-08-12 NOTE — Anesthesia Procedure Notes (Signed)
Epidural Patient location during procedure: OB Start time: 08/12/2016 11:42 AM End time: 08/12/2016 11:46 AM  Staffing Anesthesiologist: Leilani AbleHATCHETT, Jenissa Tyrell Performed: anesthesiologist   Preanesthetic Checklist Completed: patient identified, surgical consent, pre-op evaluation, timeout performed, IV checked, risks and benefits discussed and monitors and equipment checked  Epidural Patient position: sitting Prep: site prepped and draped and DuraPrep Patient monitoring: continuous pulse ox and blood pressure Approach: midline Location: L3-L4 Injection technique: LOR air  Needle:  Needle type: Tuohy  Needle gauge: 17 G Needle length: 9 cm and 9 Needle insertion depth: 5 cm cm Catheter type: closed end flexible Catheter size: 19 Gauge Catheter at skin depth: 10 cm Test dose: negative and Other  Assessment Sensory level: T9 Events: blood not aspirated, injection not painful, no injection resistance, negative IV test and no paresthesia  Additional Notes Reason for block:procedure for pain

## 2016-08-12 NOTE — Progress Notes (Addendum)
Patient ID: Rose Ellis, female   DOB: 1986/01/04, 30 y.o.   MRN: [id number] Subjective: Rose Gunthershley N Stopka is a 30 y.o. G2P1001 at [redacted]w[redacted]d by ultrasound admitted for active labor for TOLAC. Very uncomfortable with contractions. Standing at bedside rocking.  Requesting epidural, if enough cervical dilation.  Objective: Vitals:   08/12/16 0645 08/12/16 0840 08/12/16 0843 08/12/16 0844  BP:   132/85   Pulse:   (!) 105   Resp:  20    Temp: 98.4 F (36.9 C) 98.4 F (36.9 C)    TempSrc:  Oral    Weight: 88.9 kg (196 lb)   88.9 kg (196 lb)  Height: 5\' 7"  (1.702 m)   5\' 7"  (1.702 m)    No intake/output data recorded. No intake/output data recorded.   FHT:  FHR: 130 bpm, variability: moderate,  accelerations:  Present,  decelerations:  Absent UC:   regular, every 4 minutes SVE:    5.5/100/-2/vtx BBOW  Labs:   Recent Labs  08/12/16 0727  WBC 15.3*  HGB 14.0  HCT 40.3  PLT 206    Assessment / Plan: Spontaneous labor, progressing normally  Labor: Progressing normally Preeclampsia:  N/A Fetal Wellbeing:  Category I Pain Control:  Labor support without medications  Plan AROM after comfortable with epidural IV bolus started Anticipated MOD:  NSVD  *Dr. Billy Coastaavon updated on labor progress and POC  Jenae Tomasello, M, MSN, CNM 08/12/2016, 10:24 AM

## 2016-08-12 NOTE — H&P (Signed)
OB ADMISSION/ HISTORY & PHYSICAL:  Admission Date: 08/12/2016  6:27 AM  Admit Diagnosis: Term pregnancy, previous cesarean section, for trial of labor, early labor  Rose Ellis is a 30 y.o. female presenting for  Ctx, started at 2230 last night, more intense since 0200. Seen in MAU at 0330, cvx was 3-4/70/-2. Discharged home for a few hours then returned w/ ctx frequent and painful, unable to relax between them and becoming anxious. + FM, no LOF/VB. Mild nausea with IV start. .  Prenatal History: G2P1001   EDC : 08/21/2016, by Other Basis  [facility] at [facility] since 1st trim  Prenatal course complicated by previous cesarean section, arrest of dilation at 7 cm, CAN x 4  Prenatal Labs: ABO, Rh: A (03/20 0000)  Antibody: Negative (03/20 0000) Rubella: Immune (03/20 0000)  RPR: Nonreactive (03/20 0000)  HBsAg: Negative (03/20 0000)  HIV: Non-reactive (03/20 0000)  GBS: Negative (09/15 0000)  1 hr Glucola : 128   Medical / Surgical History :  Past medical history:  Past Medical History:  Diagnosis Date  . Headache(784.0)   . Hx of varicella      Past surgical history:  Past Surgical History:  Procedure Laterality Date  . CESAREAN SECTION N/A 06/08/2013   Procedure: CESAREAN SECTION;  Surgeon: Robley FriesVaishali R Mody, MD;  Location: [facility];  Service: Obstetrics;  Laterality: N/A;  . MOUTH SURGERY     x2  . spleen laceration     2009     Family History:  Family History  Problem Relation Age of Onset  . Cancer Paternal Grandmother     lung  . Cancer Paternal Grandfather     lung  . Congenital heart disease Cousin   . Cancer Maternal Grandfather     lung     Social History:  reports that she has never smoked. She has never used smokeless tobacco. She reports that she does not drink alcohol or use drugs.   Allergies: Robaxin [methocarbamol]    Current Medications at time of admission:  Prescriptions Prior to Admission  Medication Sig  Dispense Refill Last Dose  . Prenatal Vit-Fe Fumarate-FA (PRENATAL MULTIVITAMIN) TABS Take 1 tablet by mouth daily at 12 noon.   06/06/2013 at Unknown      Review of Systems: ROS As noted above  Physical Exam:     Vitals:   08/12/16 0645  Temp: 98.4 F (36.9 C)  Weight: 88.9 kg (196 lb)  Height: 5\' 7"  (1.702 m)   General: AAO x 3, NAD, breathing w/ ctx Heart: RRR Lungs: CTAB Abdomen: gravid, NT Extremities: no edema Genitalia / VE: deferred FHR: 130, mod var, + accels, no decels TOCO: q 3-4 min, palp mod.  Labs:    No results for input(s): WBC, HGB, HCT, PLT in the last 72 hours.   Assessment:  30 y.o. G2P1001 at 3020w5d for TOLAC  1. Labor: early 2. Fetal Wellbeing: Category 1  3. Pain Control: labor support w/o meds 4. GBS: neg   Plan:  1. Admit to BS 2. Routine L&D orders 3. Analgesia/anesthesia PRN  4. Encourage change in position, freedom of movement 5. Report to oncoming CNM    Consultant: Dr. Alvin Critchleytaavon    Prima Rayner C Dimitriy Carreras, CNM, MSN 08/12/2016, 7:43 AM

## 2016-08-12 NOTE — MAU Note (Signed)
Pt presents complaining of contractions since 1030 last night. Denies leaking or bleeding. Reports good fetal movement.

## 2016-08-12 NOTE — Progress Notes (Addendum)
Patient ID: Rose Ellis, female   DOB: 31-Oct-1986, 30 y.o.   MRN: [id number] S: Doing well with epidural. No pelvic pressure with contractions; barely able to feel contractions.  O: Vitals:   08/12/16 1401 08/12/16 1432 08/12/16 1501 08/12/16 1531  BP:      Pulse:      Resp: 16 16 16 16   Temp:  98.1 F (36.7 C)    TempSrc:  Oral    Weight:      Height:         FHT:  FHR: 145 bpm, variability: moderate,  accelerations:  Present,  decelerations:  Present variables with contractions / one prolonged decel UC:   regular, every 2-3 minutes SVE:   Dilation: 10 Effacement (%): 100 Station: +1 Exam by:: R Maika Kaczmarek CNM Small amount of meconium stained fluid with exam  A / P: Spontaneous labor, progressing normally  Fetal Wellbeing:  Category II Pain Control:  Epidural Continue to labor down until continued pressure or involuntary urge to push Anticipated MOD:  NSVD  Rose Ellis, Rose Ellis, M MSN, CNM 08/12/2016, 3:59 PM

## 2016-08-12 NOTE — Anesthesia Pain Management Evaluation Note (Signed)
  CRNA Pain Management Visit Note  Patient: Rose Ellis, 30 y.o., female  "Hello I am a member of the anesthesia team at [facility]. We have an anesthesia team available at all times to provide care throughout the hospital, including epidural management and anesthesia for C-section. I don't know your plan for the delivery whether it a natural birth, water birth, IV sedation, nitrous supplementation, doula or epidural, but we want to meet your pain goals."   1.Was your pain managed to your expectations on prior hospitalizations?   Yes   2.What is your expectation for pain management during this hospitalization?     Epidural and IV pain meds  3.How can we help you reach that goal? IV pain medications and epidural  Record the patient's initial score and the patient's pain goal.   Pain: 7  Pain Goal: 4 [facility] wants you to be able to say your pain was always managed very well.  Rose Ellis,Rose Ellis 08/12/2016

## 2016-08-12 NOTE — Progress Notes (Addendum)
Patient ID: Rose Ellis, female   DOB: 10-30-1986, 30 y.o.   MRN: [id number] S: Doing better with pain control since insertion of epidural.    O: Vitals:   08/12/16 1201 08/12/16 1206 08/12/16 1211 08/12/16 1216  BP: 121/77 124/77 123/77 127/71  Pulse: (!) 101 (!) 101 (!) 111 (!) 106  Resp: (!) 22 18 16 18   Temp:      TempSrc:      Weight:      Height:         FHT:  FHR: 130 bpm, variability: moderate,  accelerations:  Present,  decelerations:  Present variables UC:   regular, every 3 minutes SVE:   Dilation: 7 Effacement (%): 100 Station: 0 Exam by:: Rose Ellis. Rose Ellis, CNM Fetal head in OP position  AROM with copious amounts of moderate meconium stained fluid  A / P: Spontaneous labor, progressing normally  Fetal Wellbeing:  Category I Pain Control:  Epidural Continuous Exaggerated Sims position changes with peanut ball Anticipated MOD:  NSVD   *Dr. Billy Coastaavon updated for report to Dr. Seymour BarsLavoie (on-coming attending MD)   [name], Seamus Warehime, M MSN, CNM 08/12/2016, 12:33 PM

## 2016-08-12 NOTE — Progress Notes (Signed)
CNM in department to see patient.

## 2016-08-12 NOTE — Anesthesia Preprocedure Evaluation (Signed)
Anesthesia Evaluation  Patient identified by MRN, date of birth, ID band Patient awake    Reviewed: Allergy & Precautions, H&P , NPO status , Patient's Chart, lab work & pertinent test results  Airway Mallampati: I  TM Distance: >3 FB Neck ROM: full    Dental no notable dental hx.    Pulmonary neg pulmonary ROS,    Pulmonary exam normal        Cardiovascular negative cardio ROS Normal cardiovascular exam     Neuro/Psych negative psych ROS   GI/Hepatic negative GI ROS, Neg liver ROS,   Endo/Other  negative endocrine ROS  Renal/GU negative Renal ROS     Musculoskeletal   Abdominal (+) + obese,   Peds  Hematology negative hematology ROS (+)   Anesthesia Other Findings   Reproductive/Obstetrics (+) Pregnancy                             Anesthesia Physical Anesthesia Plan  ASA: II  Anesthesia Plan: Epidural   Post-op Pain Management:    Induction:   Airway Management Planned:   Additional Equipment:   Intra-op Plan:   Post-operative Plan:   Informed Consent: I have reviewed the patients History and Physical, chart, labs and discussed the procedure including the risks, benefits and alternatives for the proposed anesthesia with the patient or authorized representative who has indicated his/her understanding and acceptance.     Plan Discussed with:   Anesthesia Plan Comments:         Anesthesia Quick Evaluation  

## 2016-08-12 NOTE — Anesthesia Rounding Note (Signed)
  CRNA Epidural Rounding Note  Patient: Rose Ellis, 30 y.o., female  Patient's current pain level: 2  Agreed upon pain management level: 4  Epidural intervention: No   Comments:   [facility]Idriss Quackenbush 08/12/2016

## 2016-08-12 NOTE — MAU Provider Note (Signed)
Rose Ellis is a 30 y.o. G2P1001 at 4268w5d presenting for regular ctx, onset 2230 last night. Pt notes painful contractions, more regular and stronger for last 2 hours . (+) back pain. Good fetal movement, No vaginal bleeding, no leaking fluid.  Prenatal Course Source of Care: [facility]  with onset of care in 1st trim. Pregnancy complications or risk: previous C/S, arrest of dilation 7 cm, CAN x 4 Current medications:  Medications Prior to Admission  Medication Sig Dispense Refill  . ibuprofen (ADVIL,MOTRIN) 600 MG tablet Take 1 tablet (600 mg total) by mouth every 6 (six) hours. 30 tablet 0  . oxyCODONE-acetaminophen (PERCOCET/ROXICET) 5-325 MG per tablet Take 1-2 tablets by mouth every 4 (four) hours as needed. 30 tablet 0  . Prenatal Vit-Fe Fumarate-FA (PRENATAL MULTIVITAMIN) TABS Take 1 tablet by mouth daily at 12 noon.      OB History    Gravida Para Term Preterm AB Living   2 1 1     1    SAB TAB Ectopic Multiple Live Births           1     Past Medical History:  Diagnosis Date  . Headache(784.0)   . Hx of varicella    Past Surgical History:  Procedure Laterality Date  . CESAREAN SECTION N/A 06/08/2013   Procedure: CESAREAN SECTION;  Surgeon: Robley FriesVaishali R Mody, MD;  Location: [facility];  Service: Obstetrics;  Laterality: N/A;  . MOUTH SURGERY     x2  . spleen laceration     2009   Family History: family history includes Cancer in her maternal grandfather, paternal grandfather, and paternal grandmother; Congenital heart disease in her cousin. Social History:  reports that she has never smoked. She has never used smokeless tobacco. She reports that she does not drink alcohol or use drugs.  Review of Systems - Negative except pain with ctx   Dilation: 3-4 Effacement (%): 70 Station: -3 Exam by:: Rose Ellis. Rose Ellis, CNM Vtx, ROP presentation  Blood pressure 127/85, pulse 117, resp. rate 18, unknown if currently breastfeeding.  Physical Exam: Blood pressure 127/85, pulse 117, resp. rate  18, unknown if currently breastfeeding. General: NAD Heart: RRR, no murmurs Lungs: CTA b/l  Abd: Soft, NT, EFW 7 lbs  Ext: no edema Neuro: DTRs normal  Membranes:BOWI Vaginal bleeding: none  FHR:  Baseline rate 120   Variability moderate  Accelerations present  Decelerationsabsent Contractions: Frequency 3  Duration 50-60  Intensity mil/mod  Pertinent Labs/Studies:  GBS neg  Prenatal labs: ABO, Rh:  A pos Antibody:  neg Rubella: Imm RPR:   NR HBsAg:   Neg HIV:   Neg GBS:   neg 1 hr Glucola 128  Genetic screening declined Ultrasound: [id number] Result normal female anatomy, posterior placenta  Assessment: 30 y.o. G2P1001 at 468w5d, desires TOLAC  1. Labor: early, coping well with ctx pain 2. FHT category 1 3. GBS neg  Plan:  1. Discussed expectant management at home vs in hospital. Desires to wait on admission.  Encouraged rest between ctx as much as possible, hydrotherapy / bath or shower, stay well hydrated and eat light snacks To call with ctx more intense or SROM.      Neta Mendsaniela C [place] Hijazi, CNM, MSN 08/12/2016, 3:51 AM

## 2016-08-13 ENCOUNTER — Encounter (HOSPITAL_COMMUNITY): Payer: Self-pay | Admitting: Obstetrics and Gynecology

## 2016-08-13 LAB — CBC
HCT: 30.4 % — ABNORMAL LOW (ref 36.0–46.0)
Hemoglobin: 10.1 g/dL — ABNORMAL LOW (ref 12.0–15.0)
MCH: 32 pg (ref 26.0–34.0)
MCHC: 33.2 g/dL (ref 30.0–36.0)
MCV: 96.2 fL (ref 78.0–100.0)
PLATELETS: 174 10*3/uL (ref 150–400)
RBC: 3.16 MIL/uL — AB (ref 3.87–5.11)
RDW: 14.9 % (ref 11.5–15.5)
WBC: 13.6 10*3/uL — AB (ref 4.0–10.5)

## 2016-08-13 MED ORDER — SENNOSIDES-DOCUSATE SODIUM 8.6-50 MG PO TABS: 2.0000 | ORAL_TABLET | ORAL | Status: AC

## 2016-08-13 MED ORDER — IBUPROFEN 600 MG PO TABS: 600.0000 mg | ORAL_TABLET | Freq: Four times a day (QID) | ORAL | 0 refills | Status: AC

## 2016-08-13 MED ORDER — BENZOCAINE-MENTHOL 20-0.5 % EX AERO: 1.0000 "application " | INHALATION_SPRAY | CUTANEOUS | Status: AC | PRN

## 2016-08-13 MED ORDER — ACETAMINOPHEN 325 MG PO TABS: 650.0000 mg | ORAL_TABLET | ORAL | Status: AC | PRN

## 2016-08-13 MED ORDER — COCONUT OIL OIL: 1.0000 "application " | TOPICAL_OIL | 0 refills | Status: AC | PRN

## 2016-08-13 NOTE — Lactation Note (Signed)
This note was copied from a baby's chart. Lactation Consultation Note  Patient Name: Rose Ellis Today's Date: 08/13/2016 Reason for consult: Initial assessment   Initial consult with Exp BF mom of 19 hour old infant. Infant with 9 BF for 10-30 minutes, 3 voids and 3 stools since birth. Infant weight 7 lb 5.3 oz. LATCH Scores 9 by bedside RN's.  Mom reports infant is feeding well. She reports pain with initial latch that improves with feeding. She reports she has had a blister to the end of her left nipple that is now better. She was advised to use EBM to nipples post BF. Infant in crib sucking on pacifier, enc mom to wait on pacifier for a few weeks and to allow infant to meet sucking needs at the breast. Mom reports infant wanted to feed all night and mom needed a rest.   Enc mom to feed 8-12 x in 24 hours. Mom reports she knows how to hand express and has hand expressed post BF and spoon fed infant colostrum and plans to continue to do that. Mom is without questions/concerns at this time.   BF Resources Handout and LC Brochure given, informed of LC phone #, BF Support Groups and IP/OP Services. Enc mom to call with questions/concerns prn.    Maternal Data Formula Feeding for Exclusion: No Does the patient have breastfeeding experience prior to this delivery?: Yes  Feeding Feeding Type: Breast Fed  LATCH Score/Interventions Latch: Grasps breast easily, tongue down, lips flanged, rhythmical sucking.  Audible Swallowing: A few with stimulation  Type of Nipple: Everted at rest and after stimulation  Comfort (Breast/Nipple): Soft / non-tender     Hold (Positioning): No assistance needed to correctly position infant at breast.  LATCH Score: 9  Lactation Tools Discussed/Used WIC Program: No   Consult Status Consult Status: Follow-up Date: 08/14/16 Follow-up type: In-patient    Silas FloodSharon S Prerna Harold 08/13/2016, 2:39 PM

## 2016-08-13 NOTE — Progress Notes (Signed)
Patient still without rectal pressure or urge to push. Orders given to start high dose pitocin to facilitate vaginal delivery.  Anticipate NSVD in 1-2 hours.  [name], Raley Novicki, M MSN, CNM 08/12/2016 5:20 PM

## 2016-08-13 NOTE — Discharge Instructions (Signed)
Breast Pumping Tips °If you are breastfeeding, there may be times when you cannot feed your baby directly. Returning to work or going on a trip are common examples. Pumping allows you to store breast milk and feed it to your baby later.  °You may not get much milk when you first start to pump. Your breasts should start to make more after a few days. If you pump at the times you usually feed your baby, you may be able to keep making enough milk to feed your baby without also using formula. The more often you pump, the more milk you will produce.  °WHEN SHOULD I PUMP?  °· You can begin to pump soon after delivery. However, some experts recommend waiting about 4 weeks before giving your infant a bottle to make sure breastfeeding is going well.  °· If you plan to return to work, begin pumping a few weeks before. This will help you develop techniques that work best for you. It also lets you build up a supply of breast milk.   °· When you are with your infant, feed on demand and pump after each feeding.   °· When you are away from your infant for several hours, pump for about 15 minutes every 2-3 hours. Pump both breasts at the same time if you can.   °· If your infant has a formula feeding, make sure to pump around the same time.     °· If you drink any alcohol, wait 2 hours before pumping.   °HOW DO I PREPARE TO PUMP? °Your let-down reflex is the natural reaction to stimulation that makes your breast milk flow. It is easier to stimulate this reflex when you are relaxed. Find relaxation techniques that work for you. If you have difficulty with your let-down reflex, try these methods:  °· Smell one of your infant's blankets or an item of clothing.   °· Look at a picture or video of your infant.   °· Sit in a quiet, private space.   °· Massage the breast you plan to pump.   °· Place soothing warmth on the breast.   °· Play relaxing music.   °WHAT ARE SOME GENERAL BREAST PUMPING TIPS? °· Wash your hands before you pump. You  do not need to wash your nipples or breasts. °· There are three ways to pump. °¨ You can use your hand to massage and compress your breast. °¨ You can use a handheld manual pump. °¨ You can use an electric pump.   °· Make sure the suction cup (flange) on the breast pump is the right size. Place the flange directly over the nipple. If it is the wrong size or placed the wrong way, it may be painful and cause nipple damage.   °· If pumping is uncomfortable, apply a small amount of purified or modified lanolin to your nipple and areola. °· If you are using an electric pump, adjust the speed and suction power to be more comfortable. °· If pumping is painful or if you are not getting very much milk, you may need a different type of pump. A lactation consultant can help you determine what type of pump to use.   °· Keep a full water bottle near you at all times. Drinking lots of fluid helps you make more milk.  °· You can store your milk to use later. Pumped breast milk can be stored in a sealable, sterile container or plastic bag. Label all stored breast milk with the date you pumped it. °¨ Milk can stay out at room temperature for up to 8 hours. °¨   You can store your milk in the refrigerator for up to 8 days. °¨ You can store your milk in the freezer for 3 months. Thaw frozen milk using warm water. Do not put it in the microwave. °· Do not smoke. Smoking can lower your milk supply and harm your infant. If you need help quitting, ask your health care provider to recommend a program.   °WHEN SHOULD I CALL MY HEALTH CARE PROVIDER OR A LACTATION CONSULTANT? °· You are having trouble pumping. °· You are concerned that you are not making enough milk. °· You have nipple pain, soreness, or redness. °· You want to use birth control. Birth control pills may lower your milk supply. Talk to your health care provider about your options. °  °This information is not intended to replace advice given to you by your health care provider.  Make sure you discuss any questions you have with your health care provider. °  °Document Released: 04/17/2010 Document Revised: 11/02/2013 Document Reviewed: 08/20/2013 °Elsevier Interactive Patient Education ©2016 [employer] Inc[employer] ° °

## 2016-08-13 NOTE — Anesthesia Postprocedure Evaluation (Signed)
Anesthesia Post Note  Patient: Gerarda Gunthershley N Macha  Procedure(s) Performed: * No procedures listed *  Patient location during evaluation: Mother Baby Anesthesia Type: Epidural Level of consciousness: awake, awake and alert, oriented and patient cooperative Pain management: pain level controlled Vital Signs Assessment: post-procedure vital signs reviewed and stable Respiratory status: nonlabored ventilation, respiratory function stable and spontaneous breathing Cardiovascular status: stable Postop Assessment: no headache, no backache, no signs of nausea or vomiting and patient able to bend at knees Anesthetic complications: no     Last Vitals:  Vitals:   08/13/16 0215 08/13/16 0618  BP: 100/61 126/85  Pulse: 94 85  Resp: 18 18  Temp: 36.6 C 36.7 C    Last Pain:  Vitals:   08/13/16 0916  TempSrc:   PainSc: 7    Pain Goal: Patients Stated Pain Goal: 2 (08/13/16 0916)               Mkayla Steele L

## 2016-08-13 NOTE — Discharge Summary (Signed)
Obstetric Discharge Summary Reason for Admission: onset of labor and TOLAC Prenatal Procedures: ultrasound Intrapartum Procedures: spontaneous vaginal delivery Postpartum Procedures: none Complications-Operative and Postpartum: 2nd degree perineal laceration Hemoglobin  Date Value Ref Range Status  08/13/2016 10.1 (L) 12.0 - 15.0 g/dL Final    Comment:    DELTA CHECK NOTED REPEATED TO VERIFY    HCT  Date Value Ref Range Status  08/13/2016 30.4 (L) 36.0 - 46.0 % Final    Physical Exam:  General: alert, cooperative and no distress Lochia: appropriate Uterine Fundus: firm Incision: healing well DVT Evaluation: No cords or calf tenderness. No significant calf/ankle edema.  Discharge Diagnoses: Term Pregnancy-delivered and VBAC  Discharge Information: Date: 08/13/2016 Activity: pelvic rest Diet: routine Medications: PNV, Ibuprofen and Colace Condition: stable Instructions: refer to practice specific booklet Discharge to: home Follow-up Information    Neta Mendsaniela C Oluchi Pucci, CNM. Schedule an appointment as soon as possible for a visit in 6 week(s).   Specialty:  Obstetrics and Gynecology Why:  Postpartum visit Contact information: [place] [place] [id number] [phone]           Newborn Data: Live born female Lenna SciaraBrindley Grace Birth Weight: 7 lb 5.3 oz (3325 g) APGAR: 8, 9  Home with mother.  Neta Mendsaniela C Tahja Liao, CNM 08/13/2016, 9:30 AM

## 2016-08-13 NOTE — Plan of Care (Signed)
Problem: Nutritional: Goal: Mother's verbalization of comfort with breastfeeding process will improve Outcome: Completed/Met Date Met: 08/13/16 Encouraged to call for latch assessments. Patient is comfortable with breast feeding and baby seems to be doing well.  Problem: Pain Management: Goal: General experience of comfort will improve and pain level will decrease Outcome: Completed/Met Date Met: 08/13/16 Patient complaining of increased pain with breastfeeding and peri pain. Encouraged patient to empty bladder frequently and provided with sitz bath for peri pain.

## 2016-08-13 NOTE — Progress Notes (Signed)
PPD # 1 SVD Information for the patient's newborn:  Jamiria AkinMiller, Girl Khaliah [[id number]][[id number]]  female    breast feeding   Baby name: Vita ErmBrindley Grace  S:  Reports feeling sore, PO meds helping. Desires early DC today.              Tolerating po/ No nausea or vomiting             Bleeding is decreased             Pain controlled with acetaminophen and ibuprofen (OTC)             Up ad lib / ambulatory / voiding without difficulties        O:  A & O x 3, in no apparent distress              VS:  Vitals:   08/12/16 2120 08/12/16 2220 08/13/16 0215 08/13/16 0618  BP: 114/71 112/66 100/61 126/85  Pulse: 99 (!) 116 94 85  Resp: 18 18 18 18   Temp: 98.5 F (36.9 C) 98.4 F (36.9 C) 97.9 F (36.6 C) 98.1 F (36.7 C)  TempSrc: Oral Oral Axillary Oral  SpO2: 98% 98% 99%   Weight:      Height:        LABS:  Recent Labs  08/12/16 0727 08/13/16 0536  WBC 15.3* 13.6*  HGB 14.0 10.1*  HCT 40.3 30.4*  PLT 206 174    Blood type: --/--/A POS, A POS (10/02 0727)  Rubella: Immune (03/20 0000)   I&O: I/O last 3 completed shifts: In: -  Out: 1200 [Urine:800; Blood:400]          No intake/output data recorded.  Lungs: Clear and unlabored  Heart: regular rate and rhythm / no murmurs  Abdomen: soft, non-tender, non-distended             Fundus: firm, non-tender, U-2  Perineum: no edema, repair intact  Lochia: small  Extremities: no edema, no calf pain or tenderness    A/P: PPD # 1 30 y.o., [id number]   Principal Problem:   Postpartum care following vbac delivery (10/2) Active Problems:   Encounter for trial of labor   Previous cesarean section complicating pregnancy   Vaginal birth after cesarean, delivered, current hospitalization   Doing well - stable status  Routine post partum orders  DC to boarding pending NB discharge    Neta Mendsaniela C Aylana Hirschfeld, MSN, CNM 08/13/2016, 9:12 AM

## 2016-08-14 ENCOUNTER — Ambulatory Visit: Payer: Self-pay

## 2016-08-14 NOTE — Lactation Note (Signed)
This note was copied from a baby's chart. Lactation Consultation Note  Patient Name: Rose Ellis Reason for consult: Follow-up assessment Baby is 5439 hour old , baby has been consistent with feedings . And per mom baby has been cluster feeding. LC updated doc flow sheets - per parents .  Mom denies soreness, sore nipples and engorgement prevention and tx reviewed. Per mom has a DEBP at home.  LC referred to the Baby  and me booklet  Mother informed of post-discharge support and given phone number to the lactation department, including services for phone  call assistance; out-patient appointments; and breastfeeding support group. List of other breastfeeding resources in the community  given in the handout. Encouraged mother to call for problems or concerns related to breastfeeding. Maternal Data Has patient been taught Hand Expression?: Yes  Feeding Feeding Type: Breast Fed Length of feed: 10 min  LATCH Score/Interventions Latch: Grasps breast easily, tongue down, lips flanged, rhythmical sucking.  Audible Swallowing: A few with stimulation  Type of Nipple: Everted at rest and after stimulation  Comfort (Breast/Nipple): Filling, red/small blisters or bruises, mild/mod discomfort     Hold (Positioning): No assistance needed to correctly position infant at breast.  LATCH Score: 8  Lactation Tools Discussed/Used Pump Review: Milk Storage   Consult Status Consult Status: Complete Date: 08/14/16    Rose Ellis Ellis, 10:32 AM

## 2016-08-16 NOTE — Lactation Note (Signed)
Lactation Consult  Mother's reason for visit:  Sent from Peds office to get nipple shield due to sore nipples Visit Type:  Walk in Appointment Notes:  Mom was sent to get nipple shield due to sore nipples. Nipples are red/cracked bilateral. Fit Mom for 24 nipple shield on right breast and 20 nipple shield on left breast. Baby did nurse for few minutes on right breast and Mom's breasts softened, lots of breast milk in nipple shield. Mom's breasts are very full, advised to finish feeding when she gets home. Engorgement care reviewed. Care for sore nipples reviewed. Mom is using nipple cream from [employer]. [facility] not [facility] at this time. Comfort gels given with instructions to alternate with nipple cream.  OP f/u scheduled for Monday, 08/19/16 at 12:00. Mom reported less discomfort with baby nursing using nipple shield. Instructions for care/use of nipple shield given to Mom.  Consult:  Initial Lactation Consultant:  Alfred LevinsGranger, Billye Nydam Ann

## 2016-08-19 ENCOUNTER — Ambulatory Visit (HOSPITAL_COMMUNITY)
Admission: RE | Admit: 2016-08-19 | Discharge: 2016-08-19 | Disposition: A | Discharge status: Home / Self Care | Payer: BLUE CROSS/BLUE SHIELD | Source: Ambulatory Visit | Attending: Obstetrics & Gynecology | Admitting: Obstetrics & Gynecology

## 2016-08-19 NOTE — Lactation Note (Addendum)
Lactation Consult  Mother's reason for visit:  Sore nipples, regulating supply.  Visit Type:  Outpatient Appointment Notes:  Mom came to office from Peds of Friday wanting nipple shield due to sore nipples. LC fitted Mom for 24 nipple shield on right breast, 20 nipple shield on left. At that time, Mom's nipples were firey red, cracking bilateral, swollen. Mom using nipple cream from [employer], encouraged [facility] but she did not want to start that on Friday, gave Mom comfort gels with instructions. Since Friday, Mom stopped using the nipple shield on either breast reporting she did not feel baby was emptying breast well with nipple shield and nipple shield was not staying on well. Here today for f/u regarding sore nipples, latch and regulating milk supply. Mom reports she did develop some hard nodule on both breasts that are now improving.  Consult:  Follow-Up from walk in visit on Friday, 08/16/16 Lactation Consultant:  Alfred Levins  ________________________________________________________________________  Baby's Name:  Rose Ellis Date of Birth:  08/12/2016 Pediatrician:  [facility] Leighton Ruff, NP Gender:  female Gestational Age: [redacted]w[redacted]d (At Birth) Birth Weight:  7 lb 5.3 oz (3325 g) Weight at Discharge:  Weight: 6 lb 13.9 oz (3116 g)             Date of Discharge:  08/14/2016     [facility] Weights   08/12/16 1838 08/13/16 2334  Weight: 7 lb 5.3 oz (3325 g) 6 lb 13.9 oz (3116 g)  [facility]:  08/16/16  7 lb. 0 oz     Location:Pediatrician's office Weight today:  7 lb. 4.0 oz 3290 gm. Baby now 16 days old.    ________________________________________________________________________  Mother's Name: Gerarda Gunther Type of delivery:  VBAC Breastfeeding Experience:  P2, BF 1st baby for 1 year Maternal Medical Conditions:  None reported Maternal Medications: PNV, DHA, Ibuprofen  prn  ________________________________________________________________________  Breastfeeding History (Post Discharge)  Frequency of breastfeeding:  On demand, about every 2-3 hours Duration of feeding:  30-40 minutes, both breasts most feeding Mom has Medela PNS at home and will post pump if breasts feel too full after a feeding. Not supplementing baby at this time.   Infant Intake and Output Assessment  Voids:  8 in 24 hrs.  Color:  Clear yellow Stools:  5 in 24 hrs.  Color:  Yellow  ________________________________________________________________________  Maternal Breast Assessment  Breast:  Full. Left breast - small pea sized nodule at 11:00 and 1:00, tenderness at 8:00 no redness. Right breast small nodule at 2:00, tenderness at 10-11:00, no redness.  Nipple:  Erect, Reddened and Cracked bilateral. Not as swollen as on Friday. Pain level:  0- 3 Pain interventions:  Comfort gels, nipple cream by [employer], EBM  _______________________________________________________________________ Feeding Assessment/Evaluation  Initial feeding assessment:  Infant's oral assessment:  Variance.  Short labial frenulum. Short posterior lingual frenulum. Lots of chewing while nursing at breast. Baby having trouble keeping lips well flanged.   Mom latched baby to Right breast in cross cradle hold. Baby does intermittent chewing at breast especially at the beginning of the feeding. Had Mom use breast compression and try asymmetrical latch for more depth. Baby does not keep lips untucked, adjustment needed throughout the feeding. Mom did not experience pain with baby nursing but nipple was slight creased when baby came off the breast.  Baby transferred 40 ml with nursing for 20 minutes on right breast. Nodule on Mom's right breast did soften with  baby nursing.   Attached assessment:    Lips flanged:  No.  Lips untucked:  No.  Suck assessment:  Displays both  Tools:  Comfort gels, nipple shield,  breast shells given to Mom today.  Instructed on use and cleaning of tool:  Yes.    Pre-feed weight:  3290 g  (7 lb. 4.0 oz.) Post feed weight: 3330 gm ( 7 lb. 5.5 oz) Amount transferred:  40 ml  Baby nursed on left breast in cross cradle. Mom did experience pain scale of 3 with initial latch that resolved to 0 with baby nursing. This nipple is cracked and had crease with open crack after baby nursed for 18 minutes. Tried to assist with asymmetrical latch again but baby will readjust herself. Lips needed to be adjusted throughout the feeding to keep un-tucked, intermittent chewing noted. Per pre/post weight baby transferred 28 ml in 18 minutes. Nodules softening with baby nursing.  Re-sized Mom for nipple shield to use on left breast if continued to have breakdown - 20 Nipple shield appears to fit well.   Pre-feed weight:  3330 gm ( 7 lb. 5.5 oz) Post feed weight:  3358 gml ( 7lb. 6.4 oz) Amount transferred: 28 ml.   Total amount transferred at this visit was 68 ml.   Mom appears to show good technique with latching baby, LC feels baby is having difficulty due to short posterior lingual frenulum resulting in restricted tongue mobility causing baby to get shallow at times during the feedings.  In spite of chewing at breast, Baby is transferring milk well but causing trauma on Mom.   [facility] due to cracking on left nipple and LC concerned this could lead to Mom developing mastitis. Mom reports some improvement from Friday but not resolved. Advised Mom to try nipple shield again if trauma not resolving on left nipple. Also discussed pumping left breast to let nipple heal then start latching again once cracking has resolved.  With latching baby: Advised Mom to pre-pump for 3-5 minutes or less if needed to soften nipple/aerola to help baby obtain more depth with latch and to get milk flow moving. If have nodules present - apply warm compresses and massage before/while you pre-pump  to soften these areas. Nurse baby both breasts. If nodules present after nursing - post pump/massage to soften 10-15 minutes then apply ice packs. Continue Motrin as prescribed. Alternate positions with feedings to decrease trauma on nipples and help breasts empty well Keep Lips untucked. Apply APNC, can continue EBM, given breast shells to wear. Can use comfort gels when not using creams. S/S mastitis discussed, call OB if develop. OP f/u scheduled with lactation for Monday, 08/26/16  1:30 pm.  Baby has jaundice - Mom has appointment with Peds today at 2:30 for f/u.

## 2016-08-22 DIAGNOSIS — R102 Pelvic and perineal pain: Secondary | ICD-10-CM | POA: Diagnosis not present

## 2016-08-22 DIAGNOSIS — Z3483 Encounter for supervision of other normal pregnancy, third trimester: Secondary | ICD-10-CM | POA: Diagnosis not present

## 2016-08-22 DIAGNOSIS — S3141XA Laceration without foreign body of vagina and vulva, initial encounter: Secondary | ICD-10-CM | POA: Diagnosis not present

## 2016-08-22 DIAGNOSIS — Z3482 Encounter for supervision of other normal pregnancy, second trimester: Secondary | ICD-10-CM | POA: Diagnosis not present

## 2016-08-26 ENCOUNTER — Ambulatory Visit (HOSPITAL_COMMUNITY)
Admission: RE | Admit: 2016-08-26 | Discharge: 2016-08-26 | Disposition: A | Discharge status: Home / Self Care | Payer: BLUE CROSS/BLUE SHIELD | Source: Ambulatory Visit | Attending: Obstetrics and Gynecology | Admitting: Obstetrics and Gynecology

## 2016-08-26 NOTE — Lactation Note (Signed)
Lactation Consult  Mother's reason for visit:  Follow up for sore nipples Visit Type:  Outpatient Appointment Notes:   Consult:  Follow-Up Lactation Consultant:  Soma, Bachand here for Outpatient appointment to follow up on her first appointment (1 week ago), due to very sore and cracked nipples.  Initially, was taught to use a nipple shield, but quickly at home, was able to latch baby without.  Baby "did not like" the nipple shield, and Rose Ellis felt baby latched better without.  Her instruction on latching a week ago from [facility] really helped her and she feels much better.  Baby is 6 oz above her birth weight, after dropping 7 oz below (7% weight loss) on discharge.  Rose Ellis is pumping occasionally prior to latching if she feels full, and after if she still feels full.  Feeding baby primarily skin to skin, and using excellent technique to obtain a deep areolar latch.  Pain is very minimal currently.  She did fill the Rx for APNO, and is using that which is helpful.  After baby came off the breast, both nipples were pulled out and rounded.  Nipples are pink, but Rose Ellis denies any burning/stinging pain.   Encouraged Support Groups, information shared with [name], and to call us for any questions arise. ________________________________________________________________________ WUJW'J Name:  Laray Ellis Date of Birth:  08/12/2016 Pediatrician:  [facility] Leighton Ruff) Gender:  female Gestational Age: [redacted]w[redacted]d (At Birth) Birth Weight:  7 lb 5.3 oz (3325 g) Weight at Discharge:  Weight: 6 lb 13.9 oz (3116 g)             Date of Discharge:  08/14/2016     Filed Weights   08/12/16 1838 08/13/16 2334  Weight: 7 lb 5.3 oz (3325 g) 6 lb 13.9 oz (3116 g)  [facility]:  7 lbs. 0 oz at [facility] 08/16/16    Weight today:  7 lbs 11 oz an increase of 11 oz in 10 days! _______________________________________________________________________ Mother's  Name: Rose Ellis Type of delivery:  VBAC Breastfeeding Experience:  11 1/2 months with first baby Maternal Medical Conditions:  none Maternal Medications:  PNV, APNO for sore nipple, Percocet 1 X day, Dulcolax 1 X day ________________________________________________________________________  Breastfeeding History (Post Discharge) Frequency of breastfeeding:  2-3 hrs Duration of feeding:  30-60 minutes Patient does not supplement or pump. Occasional pumping when breasts are full prior to latch, or if full post feeding Infant Intake and Output Assessment Voids:  8-12 in 24 hrs.  Color:  Clear yellow Stools:  8 in 24 hrs.  Color:  Yellow ________________________________________________________________________  Maternal Breast Assessment Breast:  Soft Nipple:  Erect, pink, and left nipple healing crack Pain level:  2 Pain interventions:  All purpose nipple cream _______________________________________________________________________ Feeding Assessment/Evaluation Initial feeding assessment: Infant's oral assessment:  WNL= Positioning:  Cross cradle Right breast LATCH documentation:  Latch:  2 = Grasps breast easily, tongue down, lips flanged, rhythmical sucking.  Audible swallowing:  2 = Spontaneous and intermittent  Type of nipple:  2 = Everted at rest and after stimulation  Comfort (Breast/Nipple):  2 = Soft / non-tender  Hold (Positioning):  2 = No assistance needed to correctly position infant at breast  LATCH score:  10 Attached assessment:  Deep  Lips flanged:  Yes.    Lips untucked:  Yes.   Suck assessment:  Nutritive Pre-feed weight:  3488 g   Post-feed weight: 3522 g  Amount  transferred:  34 ml  Additional Feeding Assessment -  Infant's oral assessment:  WNL Positioning:  Cross cradle Rose Ellis(Icis does alternate nursing positions) Left breast LATCH documentation:  Latch:  2 = Grasps breast easily, tongue down, lips flanged, rhythmical sucking.  Audible swallowing:   2 = Spontaneous and intermittent  Type of nipple:  2 = Everted at rest and after stimulation  Comfort (Breast/Nipple):  1= reddened and healing crack  Hold (Positioning):  2 = No assistance needed to correctly position infant at breast  LATCH score:  9 Attached assessment:  Deep  Lips flanged:  Yes.    Lips untucked:  Yes.   Suck assessment:  Nutritive Pre-feed weight:  3520 g   Post-feed weight: 3552 g  Amount transferred:  32ml  Total amount transferred:  66 ml

## 2016-09-17 DIAGNOSIS — N898 Other specified noninflammatory disorders of vagina: Secondary | ICD-10-CM | POA: Diagnosis not present

## 2016-10-08 DIAGNOSIS — R102 Pelvic and perineal pain: Secondary | ICD-10-CM | POA: Diagnosis not present

## 2016-10-08 DIAGNOSIS — N898 Other specified noninflammatory disorders of vagina: Secondary | ICD-10-CM | POA: Diagnosis not present

## 2016-10-24 DIAGNOSIS — R102 Pelvic and perineal pain: Secondary | ICD-10-CM | POA: Diagnosis not present

## 2016-11-07 DIAGNOSIS — Z3482 Encounter for supervision of other normal pregnancy, second trimester: Secondary | ICD-10-CM | POA: Diagnosis not present

## 2016-11-07 DIAGNOSIS — Z3483 Encounter for supervision of other normal pregnancy, third trimester: Secondary | ICD-10-CM | POA: Diagnosis not present

## 2016-12-18 DIAGNOSIS — Z6828 Body mass index (BMI) 28.0-28.9, adult: Secondary | ICD-10-CM | POA: Diagnosis not present

## 2016-12-18 DIAGNOSIS — N760 Acute vaginitis: Secondary | ICD-10-CM | POA: Diagnosis not present

## 2016-12-18 DIAGNOSIS — Z01419 Encounter for gynecological examination (general) (routine) without abnormal findings: Secondary | ICD-10-CM | POA: Diagnosis not present

## 2016-12-18 DIAGNOSIS — E0789 Other specified disorders of thyroid: Secondary | ICD-10-CM | POA: Diagnosis not present

## 2016-12-18 DIAGNOSIS — N949 Unspecified condition associated with female genital organs and menstrual cycle: Secondary | ICD-10-CM | POA: Diagnosis not present

## 2016-12-31 DIAGNOSIS — R102 Pelvic and perineal pain: Secondary | ICD-10-CM | POA: Diagnosis not present

## 2017-01-06 DIAGNOSIS — M6281 Muscle weakness (generalized): Secondary | ICD-10-CM | POA: Diagnosis not present

## 2017-01-06 DIAGNOSIS — R102 Pelvic and perineal pain: Secondary | ICD-10-CM | POA: Diagnosis not present

## 2017-01-06 DIAGNOSIS — M62838 Other muscle spasm: Secondary | ICD-10-CM | POA: Diagnosis not present

## 2017-01-15 DIAGNOSIS — R102 Pelvic and perineal pain: Secondary | ICD-10-CM | POA: Diagnosis not present

## 2017-01-15 DIAGNOSIS — M62838 Other muscle spasm: Secondary | ICD-10-CM | POA: Diagnosis not present

## 2017-01-15 DIAGNOSIS — M6281 Muscle weakness (generalized): Secondary | ICD-10-CM | POA: Diagnosis not present

## 2017-01-20 DIAGNOSIS — N941 Unspecified dyspareunia: Secondary | ICD-10-CM | POA: Diagnosis not present

## 2017-01-20 DIAGNOSIS — M62838 Other muscle spasm: Secondary | ICD-10-CM | POA: Diagnosis not present

## 2017-01-20 DIAGNOSIS — M6281 Muscle weakness (generalized): Secondary | ICD-10-CM | POA: Diagnosis not present

## 2017-01-20 DIAGNOSIS — R102 Pelvic and perineal pain: Secondary | ICD-10-CM | POA: Diagnosis not present

## 2017-01-28 DIAGNOSIS — M6281 Muscle weakness (generalized): Secondary | ICD-10-CM | POA: Diagnosis not present

## 2017-01-28 DIAGNOSIS — R102 Pelvic and perineal pain: Secondary | ICD-10-CM | POA: Diagnosis not present

## 2017-01-28 DIAGNOSIS — N941 Unspecified dyspareunia: Secondary | ICD-10-CM | POA: Diagnosis not present

## 2017-01-28 DIAGNOSIS — M62838 Other muscle spasm: Secondary | ICD-10-CM | POA: Diagnosis not present

## 2017-02-12 DIAGNOSIS — N941 Unspecified dyspareunia: Secondary | ICD-10-CM | POA: Diagnosis not present

## 2017-02-12 DIAGNOSIS — R102 Pelvic and perineal pain: Secondary | ICD-10-CM | POA: Diagnosis not present

## 2017-02-12 DIAGNOSIS — M62838 Other muscle spasm: Secondary | ICD-10-CM | POA: Diagnosis not present

## 2017-02-12 DIAGNOSIS — M6281 Muscle weakness (generalized): Secondary | ICD-10-CM | POA: Diagnosis not present

## 2017-02-24 DIAGNOSIS — R102 Pelvic and perineal pain: Secondary | ICD-10-CM | POA: Diagnosis not present

## 2017-02-24 DIAGNOSIS — M6281 Muscle weakness (generalized): Secondary | ICD-10-CM | POA: Diagnosis not present

## 2017-02-24 DIAGNOSIS — M62838 Other muscle spasm: Secondary | ICD-10-CM | POA: Diagnosis not present

## 2017-02-24 DIAGNOSIS — N941 Unspecified dyspareunia: Secondary | ICD-10-CM | POA: Diagnosis not present

## 2017-10-15 DIAGNOSIS — F458 Other somatoform disorders: Secondary | ICD-10-CM | POA: Diagnosis not present

## 2017-10-15 DIAGNOSIS — R0982 Postnasal drip: Secondary | ICD-10-CM | POA: Diagnosis not present

## 2017-11-05 DIAGNOSIS — J029 Acute pharyngitis, unspecified: Secondary | ICD-10-CM | POA: Diagnosis not present

## 2017-11-05 DIAGNOSIS — J020 Streptococcal pharyngitis: Secondary | ICD-10-CM | POA: Diagnosis not present

## 2017-12-26 DIAGNOSIS — J069 Acute upper respiratory infection, unspecified: Secondary | ICD-10-CM | POA: Diagnosis not present

## 2018-08-25 DIAGNOSIS — N760 Acute vaginitis: Secondary | ICD-10-CM | POA: Diagnosis not present

## 2018-08-25 DIAGNOSIS — N920 Excessive and frequent menstruation with regular cycle: Secondary | ICD-10-CM | POA: Diagnosis not present

## 2018-08-25 DIAGNOSIS — Z6825 Body mass index (BMI) 25.0-25.9, adult: Secondary | ICD-10-CM | POA: Diagnosis not present

## 2018-08-25 DIAGNOSIS — Z01419 Encounter for gynecological examination (general) (routine) without abnormal findings: Secondary | ICD-10-CM | POA: Diagnosis not present

## 2019-01-06 DIAGNOSIS — B373 Candidiasis of vulva and vagina: Secondary | ICD-10-CM | POA: Diagnosis not present

## 2019-01-06 DIAGNOSIS — N760 Acute vaginitis: Secondary | ICD-10-CM | POA: Diagnosis not present

## 2019-01-06 DIAGNOSIS — R55 Syncope and collapse: Secondary | ICD-10-CM | POA: Diagnosis not present

## 2019-01-06 DIAGNOSIS — N920 Excessive and frequent menstruation with regular cycle: Secondary | ICD-10-CM | POA: Diagnosis not present

## 2019-01-12 DIAGNOSIS — M25531 Pain in right wrist: Secondary | ICD-10-CM | POA: Diagnosis not present

## 2019-01-14 DIAGNOSIS — M25531 Pain in right wrist: Secondary | ICD-10-CM | POA: Diagnosis not present

## 2019-01-29 DIAGNOSIS — N946 Dysmenorrhea, unspecified: Secondary | ICD-10-CM | POA: Diagnosis not present

## 2019-01-29 DIAGNOSIS — N920 Excessive and frequent menstruation with regular cycle: Secondary | ICD-10-CM | POA: Diagnosis not present

## 2019-02-15 DIAGNOSIS — R102 Pelvic and perineal pain: Secondary | ICD-10-CM | POA: Diagnosis not present

## 2019-02-15 DIAGNOSIS — N898 Other specified noninflammatory disorders of vagina: Secondary | ICD-10-CM | POA: Diagnosis not present

## 2019-04-26 ENCOUNTER — Encounter (HOSPITAL_COMMUNITY): Payer: Self-pay | Admitting: Emergency Medicine

## 2019-04-26 ENCOUNTER — Emergency Department (HOSPITAL_COMMUNITY): Payer: BLUE CROSS/BLUE SHIELD

## 2019-04-26 ENCOUNTER — Emergency Department (HOSPITAL_COMMUNITY)
Admission: EM | Admit: 2019-04-26 | Discharge: 2019-04-26 | Disposition: A | Discharge status: Home / Self Care | Payer: BLUE CROSS/BLUE SHIELD | Attending: Emergency Medicine | Admitting: Emergency Medicine

## 2019-04-26 DIAGNOSIS — H53149 Visual discomfort, unspecified: Secondary | ICD-10-CM | POA: Diagnosis not present

## 2019-04-26 DIAGNOSIS — R519 Headache, unspecified: Secondary | ICD-10-CM

## 2019-04-26 DIAGNOSIS — H538 Other visual disturbances: Secondary | ICD-10-CM | POA: Insufficient documentation

## 2019-04-26 DIAGNOSIS — R51 Headache: Secondary | ICD-10-CM | POA: Insufficient documentation

## 2019-04-26 DIAGNOSIS — R110 Nausea: Secondary | ICD-10-CM | POA: Diagnosis not present

## 2019-04-26 LAB — CBC
HCT: 44.8 % (ref 36.0–46.0)
Hemoglobin: 14.7 g/dL (ref 12.0–15.0)
MCH: 30 pg (ref 26.0–34.0)
MCHC: 32.8 g/dL (ref 30.0–36.0)
MCV: 91.4 fL (ref 80.0–100.0)
Platelets: 324 10*3/uL (ref 150–400)
RBC: 4.9 MIL/uL (ref 3.87–5.11)
RDW: 13 % (ref 11.5–15.5)
WBC: 9.8 10*3/uL (ref 4.0–10.5)
nRBC: 0 % (ref 0.0–0.2)

## 2019-04-26 LAB — I-STAT BETA HCG BLOOD, ED (MC, WL, AP ONLY): I-stat hCG, quantitative: <5 m[IU]/mL (ref <5)

## 2019-04-26 LAB — COMPREHENSIVE METABOLIC PANEL
ALT: 15 U/L (ref 0–44)
AST: 18 U/L (ref 15–41)
Albumin: 3.9 g/dL (ref 3.5–5.0)
Alkaline Phosphatase: 52 U/L (ref 38–126)
Anion gap: 8 (ref 5–15)
BUN: 8 mg/dL (ref 6–20)
CO2: 27 mmol/L (ref 22–32)
Calcium: 9 mg/dL (ref 8.9–10.3)
Chloride: 101 mmol/L (ref 98–111)
Creatinine, Ser: 0.81 mg/dL (ref 0.44–1.00)
GFR calc Af Amer: >60 mL/min (ref >60)
GFR calc non Af Amer: >60 mL/min (ref >60)
Glucose, Bld: 133 mg/dL — ABNORMAL HIGH (ref 70–99)
Potassium: 3.8 mmol/L (ref 3.5–5.1)
Sodium: 136 mmol/L (ref 135–145)
Total Bilirubin: 0.7 mg/dL (ref 0.3–1.2)
Total Protein: 7 g/dL (ref 6.5–8.1)

## 2019-04-26 MED ORDER — DEXAMETHASONE SODIUM PHOSPHATE 10 MG/ML IJ SOLN
10.0000 mg | Freq: Once | INTRAMUSCULAR | Status: AC
  Administered 2019-04-26: 10 mg via INTRAVENOUS
  Filled 2019-04-26: qty 1

## 2019-04-26 MED ORDER — SODIUM CHLORIDE 0.9 % IV BOLUS
1000.0000 mL | Freq: Once | INTRAVENOUS | Status: AC
  Administered 2019-04-26: 1000 mL via INTRAVENOUS

## 2019-04-26 MED ORDER — DIPHENHYDRAMINE HCL 50 MG/ML IJ SOLN
25.0000 mg | Freq: Once | INTRAMUSCULAR | Status: AC
  Administered 2019-04-26: 25 mg via INTRAVENOUS
  Filled 2019-04-26: qty 1

## 2019-04-26 MED ORDER — PROCHLORPERAZINE EDISYLATE 10 MG/2ML IJ SOLN
10.0000 mg | Freq: Once | INTRAMUSCULAR | Status: AC
  Administered 2019-04-26: 10 mg via INTRAVENOUS
  Filled 2019-04-26: qty 2

## 2019-04-26 NOTE — ED Provider Notes (Signed)
[facility] Provider Note   CSN: [id number] Arrival date & time: 04/26/19  1507    History   Chief Complaint No chief complaint on file.   HPI Gerarda Gunthershley N Hollingsworth is a 33 y.o. female presents today for evaluation of acute onset, persistent headache that began around 7412 PM today.  She states that at around noon she was working out at home, lifting weights.  She states that as she was starting to cool down and in her workout she began to "see spots "and then developed blurred vision bilaterally  that lasted around half an hour.  She states that shortly after the blurred vision began she began to feel a severe sharp pressure to the frontal region and periorbital region.  Also notes some photophobia and phonophobia.  Headache worsens with leaning forward.  She notes associated nausea but no vomiting and had an episode of left hand numbness lasting for a short time before resolving.  She has tried ibuprofen without relief of symptoms.  Denies recent travel, fevers, neck stiffness, or recent trauma.  Denies chest pain, shortness of breath, abdominal pain, weakness in her extremities.  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  She reports they were sometimes preceded by auras.  She denies any other instances of exertional headaches.  No family history of CVA or aneurysm.     The history is provided by the patient.    Past Medical History:  Diagnosis Date  . Headache(784.0)   . Hx of varicella   . Postpartum care following vbac delivery (10/2) 08/12/2016  . Vaginal birth after cesarean, delivered, current hospitalization 08/12/2016    Patient Active Problem List   Diagnosis Date Noted  . Encounter for trial of labor 08/12/2016  . Previous cesarean section complicating pregnancy 08/12/2016  . Postpartum care following vbac delivery (10/2) 08/12/2016  . Vaginal birth after  cesarean, delivered, current hospitalization 08/12/2016    Past Surgical History:  Procedure Laterality Date  . CESAREAN SECTION N/A 06/08/2013   Procedure: CESAREAN SECTION;  Surgeon: Robley FriesVaishali R Mody, MD;  Location: [facility];  Service: Obstetrics;  Laterality: N/A;  . MOUTH SURGERY     x2  . spleen laceration     2009     OB History    Gravida  2   Para  2   Term  2   Preterm      AB      Living  2     SAB      TAB      Ectopic      Multiple  0   Live Births  2            Home Medications    Prior to Admission medications   Medication Sig Start Date End Date Taking? Authorizing Provider  acetaminophen (TYLENOL) 325 MG tablet Take 2 tablets (650 mg total) by mouth every 4 (four) hours as needed (for pain scale < 4). 08/13/16   Neta MendsPaul, Daniela C, CNM  benzocaine-Menthol (DERMOPLAST) 20-0.5 % AERO Apply 1 application topically as needed (perineal discomfort). 08/13/16   Neta MendsPaul, Daniela C, CNM  coconut oil OIL Apply 1 application topically as needed. 08/13/16   Neta MendsPaul, Daniela C, CNM  ibuprofen (ADVIL,MOTRIN) 600 MG tablet Take 1 tablet (600 mg total) by mouth every 6 (six) hours. 08/13/16   Neta MendsPaul, Daniela C, CNM  Magnesium 250 MG  TABS Take 250 mg by mouth at bedtime.    [provider]  Prenatal Vit-Fe Fumarate-FA (PRENATAL MULTIVITAMIN) TABS Take 1 tablet by mouth daily.     [provider]  senna-docusate (SENOKOT-S) 8.6-50 MG tablet Take 2 tablets by mouth daily. 08/14/16   Neta MendsPaul, Daniela C, CNM    Family History Family History  Problem Relation Age of Onset  . Cancer Paternal Grandmother        lung  . Cancer Paternal Grandfather        lung  . Congenital heart disease Cousin   . Cancer Maternal Grandfather        lung    Social History Social History   Tobacco Use  . Smoking status: Never Smoker  . Smokeless tobacco: Never Used  Substance Use Topics  . Alcohol use: No  . Drug use: No     Allergies   Robaxin [methocarbamol]    Review of Systems Review of Systems  Constitutional: Negative for chills and fever.  Eyes: Positive for photophobia and visual disturbance.  Respiratory: Negative for cough and shortness of breath.   Cardiovascular: Negative for chest pain.  Gastrointestinal: Positive for nausea. Negative for abdominal pain and vomiting.  Musculoskeletal: Negative for back pain.  Neurological: Positive for numbness (resolved) and headaches. Negative for weakness.  All other systems reviewed and are negative.    Physical Exam Updated Vital Signs BP 128/80 (BP Location: Right Arm)   Pulse 95   Temp 98.3 F (36.8 C)   Resp 18   SpO2 100%   Physical Exam Vitals signs and nursing note reviewed.  Constitutional:      General: She is not in acute distress.    Appearance: She is well-developed.  HENT:     Head: Normocephalic and atraumatic.  Eyes:     General:        Right eye: No discharge.        Left eye: No discharge.     Extraocular Movements: Extraocular movements intact.     Conjunctiva/sclera: Conjunctivae normal.     Pupils: Pupils are equal, round, and reactive to light.  Neck:     Musculoskeletal: Normal range of motion and neck supple. No neck rigidity.     Vascular: No JVD.     Trachea: No tracheal deviation.  Cardiovascular:     Rate and Rhythm: Normal rate and regular rhythm.     Pulses: Normal pulses.     Heart sounds: Normal heart sounds.  Pulmonary:     Effort: Pulmonary effort is normal.     Breath sounds: Normal breath sounds.  Abdominal:     General: Abdomen is flat. Bowel sounds are normal. There is no distension.     Palpations: Abdomen is soft.     Tenderness: There is no abdominal tenderness. There is no guarding or rebound.  Musculoskeletal: Normal range of motion.        General: No swelling or tenderness.  Skin:    General: Skin is warm and dry.     Findings: No erythema.  Neurological:     General: No focal deficit present.     Mental Status: She is  alert and oriented to person, place, and time.     Comments: Mental Status:  Alert, thought content appropriate, able to give a coherent history. Speech fluent without evidence of aphasia. Able to follow 2 step commands without difficulty.  Cranial Nerves:  II:  Peripheral visual fields grossly normal, pupils equal, round,  reactive to light III,IV, VI: ptosis not present, extra-ocular motions intact bilaterally  V,VII: smile symmetric, facial light touch sensation equal VIII: hearing grossly normal to voice  X: uvula elevates symmetrically  XI: bilateral shoulder shrug symmetric and strong XII: midline tongue extension without fassiculations Motor:  Normal tone. 5/5 strength of BUE and BLE major muscle groups including strong and equal grip strength and dorsiflexion/plantar flexion, no pronator drift Sensory: light touch normal in all extremities. Cerebellar: normal finger-to-nose with bilateral upper extremities, Romberg sign absent Gait: normal gait and balance. Able to walk on toes and heels with ease.     Psychiatric:        Behavior: Behavior normal.      ED Treatments / Results  Labs (all labs ordered are listed, but only abnormal results are displayed) Labs Reviewed  COMPREHENSIVE METABOLIC PANEL - Abnormal; Notable for the following components:      Result Value   Glucose, Bld 133 (*)    All other components within normal limits  CBC  I-STAT BETA HCG BLOOD, ED ([facility], [facility], [facility] ONLY)    EKG None  Radiology Ct Head Wo Contrast  Result Date: 04/26/2019 CLINICAL DATA:  Headache and nausea. EXAM: CT HEAD WITHOUT CONTRAST TECHNIQUE: Contiguous axial images were obtained from the base of the skull through the vertex without intravenous contrast. COMPARISON:  None. FINDINGS: Brain: The ventricles are normal in size and configuration. No extra-axial fluid collections are identified. The gray-white differentiation is maintained. No CT findings for acute hemispheric infarction or  intracranial hemorrhage. No mass lesions. The brainstem and cerebellum are normal. Vascular: No hyperdense vessels or obvious aneurysm. Skull: No acute skull fracture.  No bone lesion. Sinuses/Orbits: The paranasal sinuses and mastoid air cells are clear except for a few small mucous retention cysts or polyps in the maxillary sinuses. The globes are intact. Other: No scalp lesions, laceration or hematoma. IMPRESSION: No acute intracranial findings. Electronically Signed   By: Rudie MeyerP.  Gallerani M.D.   On: 04/26/2019 16:52    Procedures Procedures (including critical care time)  Medications Ordered in ED Medications  sodium chloride 0.9 % bolus 1,000 mL (0 mLs Intravenous Stopped 04/26/19 1846)  prochlorperazine (COMPAZINE) injection 10 mg (10 mg Intravenous Given 04/26/19 1658)  diphenhydrAMINE (BENADRYL) injection 25 mg (25 mg Intravenous Given 04/26/19 1658)  dexamethasone (DECADRON) injection 10 mg (10 mg Intravenous Given 04/26/19 1842)     Initial Impression / Assessment and Plan / ED Course  I have reviewed the triage vital signs and the nursing notes.  Pertinent labs & imaging results that were available during my care of the patient were reviewed by me and considered in my medical decision making (see chart for details).        Patient presenting for evaluation of acute onset headache at around noon today.  Prior to headache, she had some "floaters"/stars in her eyes and blurred vision.  These have resolved.  She is afebrile, initially mildly hypertensive and mildly tachycardic with complete resolution on reevaluation of vital signs.  She is nontoxic in appearance.  She has a normal neurologic examination with no visual field deficit, ataxia, or dysmetria.  However, given it has been years since she has had any migraine-type headaches and her headache symptoms came on suddenly, will obtain a noncontrasted head CT for further evaluation.  She is within the 6-hour window that if the head CT is  negative, she likely does not have a subarachnoid hemorrhage.  We will also evaluate for mass-effect,  other hemorrhage, or CVA.  We will also give migraine cocktail and reassess  5:16 PM Head CT is unremarkable.  Spoke with Dr. Malen Gauze with neurology in consultation who recommends obtaining MRI/MRA of head if patient continues to be symptomatic after migraine cocktail.  Otherwise if symptoms have improved/resolved then patient is likely stable for discharge home.  On reassessment patient is resting comfortably, reports her headache is down to a 2/10 in severity.  She has not had any visual changes or other concerning neurologic symptoms while she has been in the ED and on reassessment has no vision changes, no abnormal coordination.  Doubt CVA, meningitis, ICH, SAH, aneurysm.  Will discharge with instructions to follow-up with her PCP or neurologist for reevaluation of her symptoms.  Discussed strict ED return precautions. Patient verbalized understanding of and agreement with plan and is safe for discharge home at this time.   Final Clinical Impressions(s) / ED Diagnoses   Final diagnoses:  Bad headache    ED Discharge Orders    None       Debroah Baller 04/26/19 2142    Maudie Flakes, MD 04/27/19 [phone]

## 2019-04-26 NOTE — ED Triage Notes (Signed)
Pt here from with c/o migraine along with some nausea ,history of migraines in college

## 2019-04-26 NOTE — ED Notes (Signed)
Visual acuity complete. Pt 20/20 with right and left eye

## 2019-04-26 NOTE — Discharge Instructions (Signed)
You can alternate 600 mg of ibuprofen with food and 500 to 1000 mg of Tylenol every 3-6 hours as needed for headaches.  Plenty fluids and get plenty of rest.  Follow-up with your primary care provider for reevaluation of your symptoms especially if the headaches persist.  We discussed that the headaches could also be a side effect of your birth control medication.  Return to the emergency department if any concerning signs or symptoms develop such as fever, worsening headache, persistent vomiting, altered mental status, weakness to one side of the body, or passing out.

## 2019-05-06 DIAGNOSIS — F411 Generalized anxiety disorder: Secondary | ICD-10-CM | POA: Diagnosis not present

## 2019-05-27 DIAGNOSIS — F411 Generalized anxiety disorder: Secondary | ICD-10-CM | POA: Diagnosis not present

## 2019-06-09 DIAGNOSIS — F411 Generalized anxiety disorder: Secondary | ICD-10-CM | POA: Diagnosis not present

## 2019-06-23 DIAGNOSIS — F411 Generalized anxiety disorder: Secondary | ICD-10-CM | POA: Diagnosis not present

## 2019-07-28 DIAGNOSIS — N760 Acute vaginitis: Secondary | ICD-10-CM | POA: Diagnosis not present

## 2019-08-02 DIAGNOSIS — F411 Generalized anxiety disorder: Secondary | ICD-10-CM | POA: Diagnosis not present

## 2019-08-30 DIAGNOSIS — F411 Generalized anxiety disorder: Secondary | ICD-10-CM | POA: Diagnosis not present

## 2019-09-21 DIAGNOSIS — F411 Generalized anxiety disorder: Secondary | ICD-10-CM | POA: Diagnosis not present

## 2019-10-01 DIAGNOSIS — Z01419 Encounter for gynecological examination (general) (routine) without abnormal findings: Secondary | ICD-10-CM | POA: Diagnosis not present

## 2019-10-01 DIAGNOSIS — Z6827 Body mass index (BMI) 27.0-27.9, adult: Secondary | ICD-10-CM | POA: Diagnosis not present

## 2019-10-01 DIAGNOSIS — Z1151 Encounter for screening for human papillomavirus (HPV): Secondary | ICD-10-CM | POA: Diagnosis not present

## 2020-09-29 IMAGING — CT CT HEAD WITHOUT CONTRAST
4 series · 17 of 47 positions shown, 19 images · non-contrast
Comparison: None.

CLINICAL DATA: Headache and nausea.

EXAM:
CT HEAD WITHOUT CONTRAST
TECHNIQUE: Contiguous axial images were obtained from the base of the skull
through the vertex without intravenous contrast.

[Series 3: head without · axial · non-contrast · 0.45mm/px · z∈[+380,+500]mm · 7 of 34 slices shown, 9 images]
[im 5/34  brain]
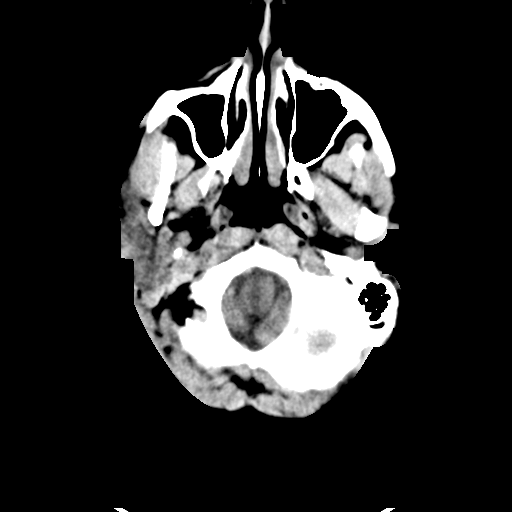
[im 5/34  bone]
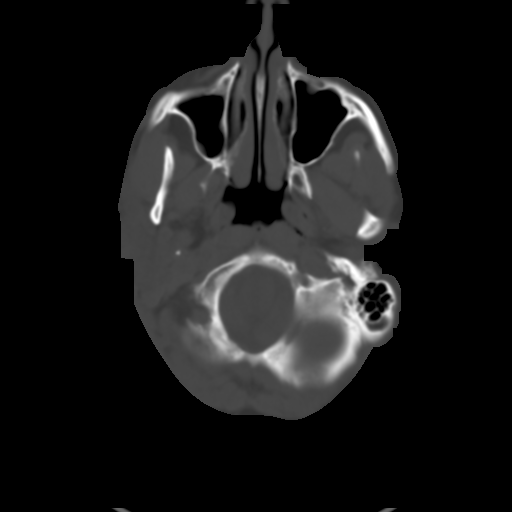
[im 9/34  brain]
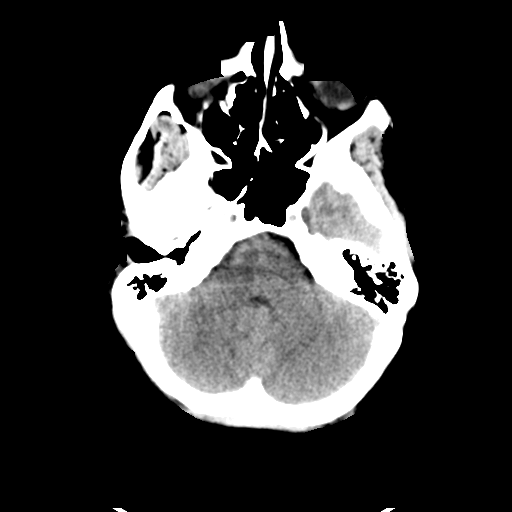
[im 13/34  brain]
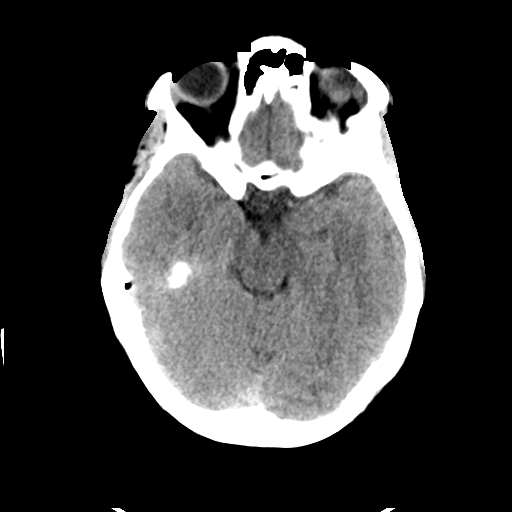
[im 17/34  brain]
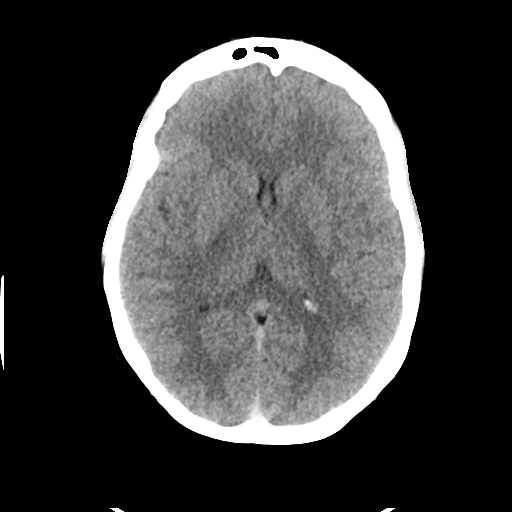
[im 21/34  brain]
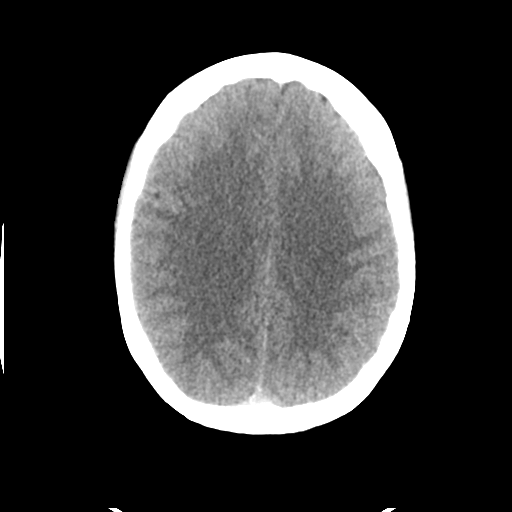
[im 21/34  bone]
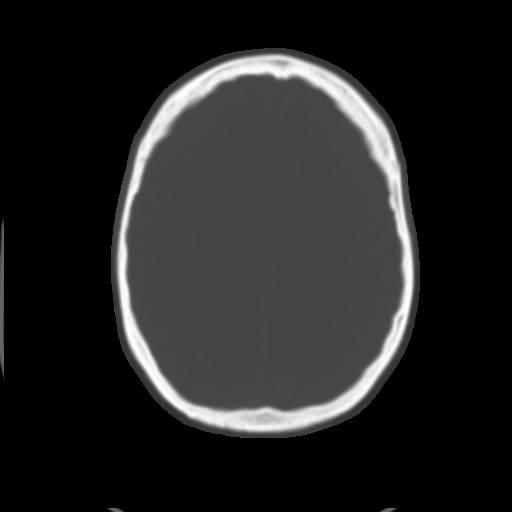
[im 25/34  brain]
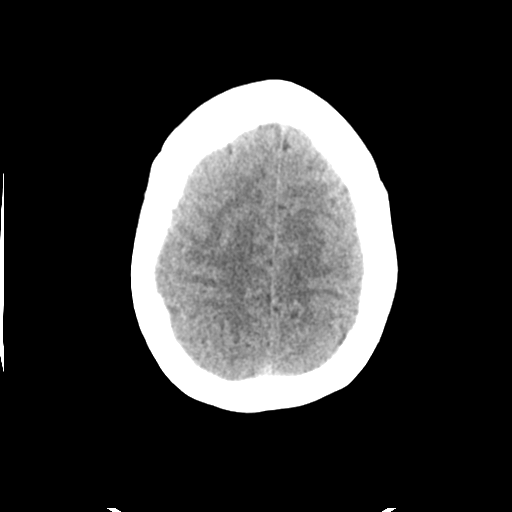
[im 29/34  brain]
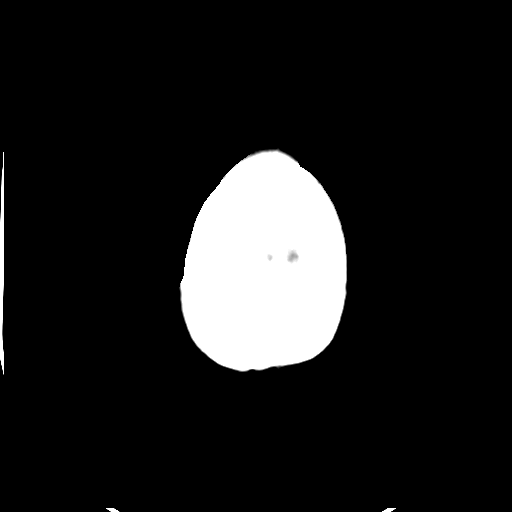

[Series 4: head bone · axial · 0.45mm/px · z∈[+376,+434]mm · 4 of 85 slices shown]
[im 9/85  bone]
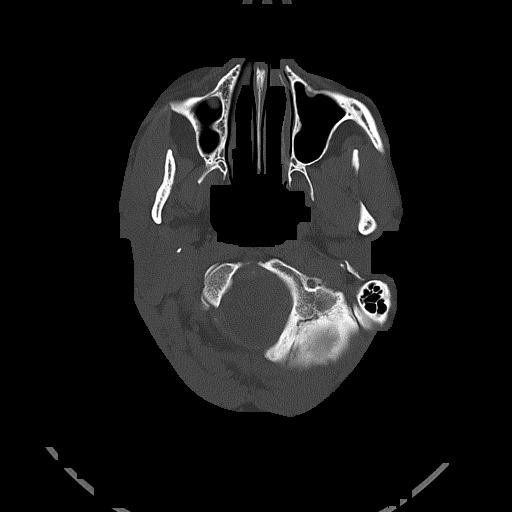
[im 17/85  bone]
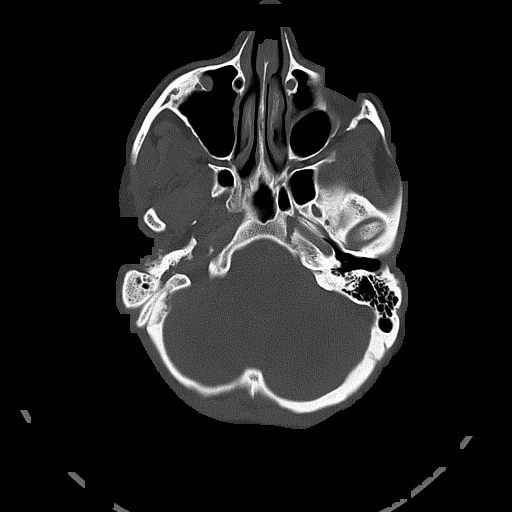
[im 26/85  bone]
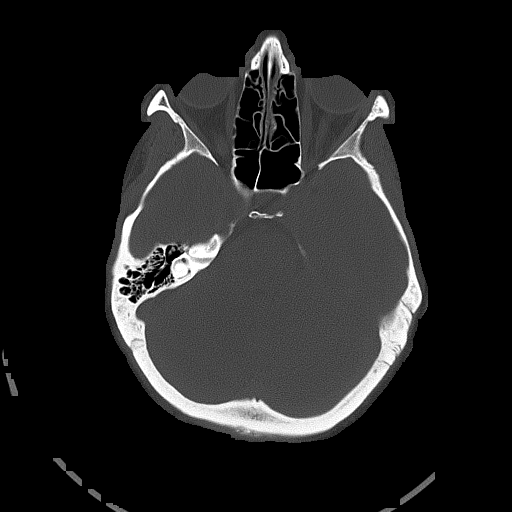
[im 38/85  bone]
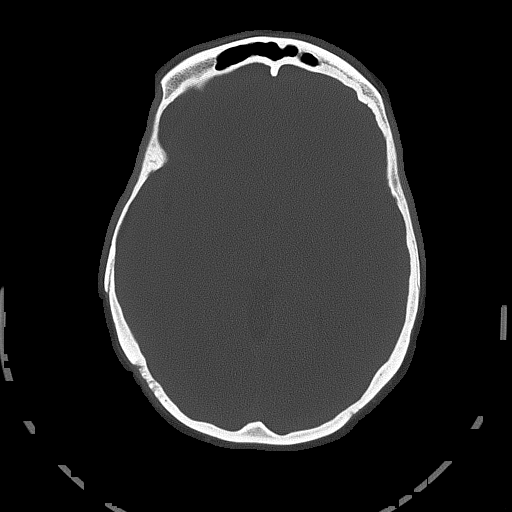

[Series 5: head without cor · coronal · non-contrast · 0.33mm/px · 3 of 69 slices shown]
[im 23/69  brain]
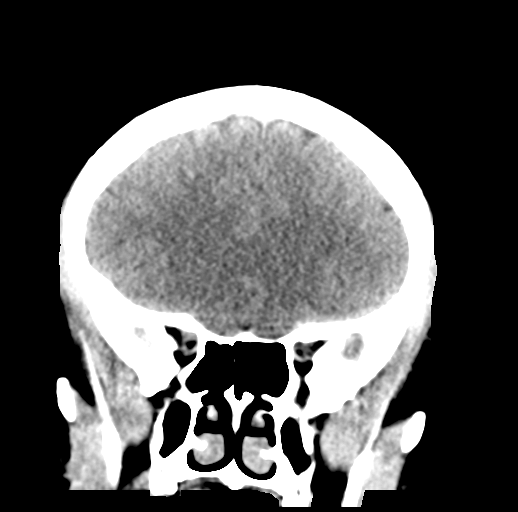
[im 31/69  brain]
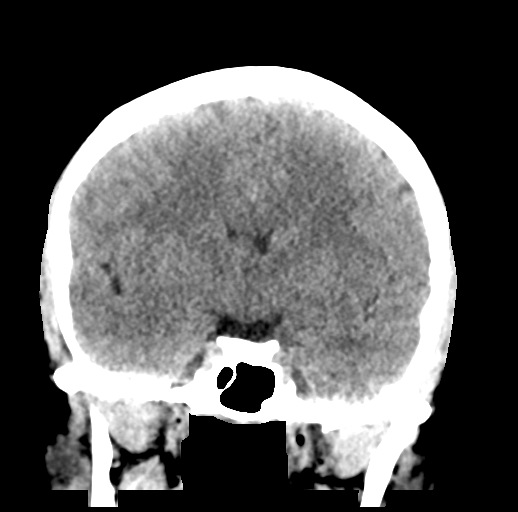
[im 38/69  brain]
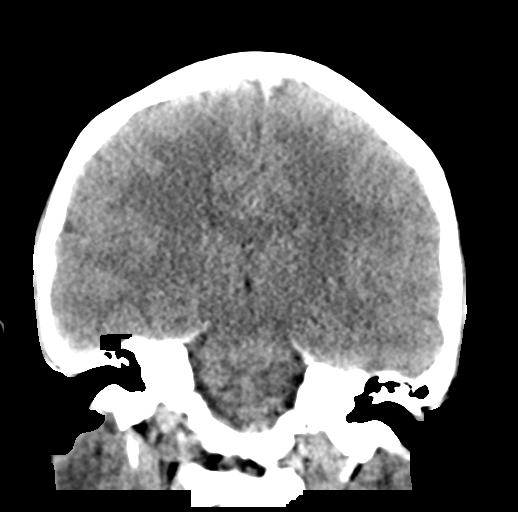

[Series 6: head without sag · sagittal · non-contrast · 0.33mm/px · 3 of 55 slices shown]
[im 19/55  brain]
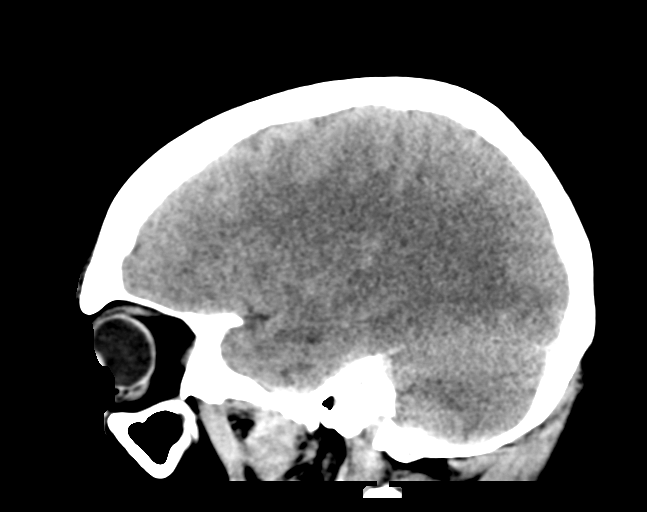
[im 28/55  brain]
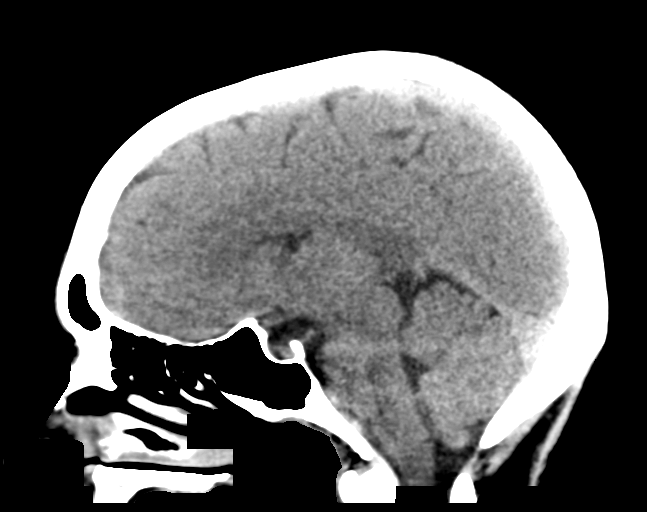
[im 37/55  brain]
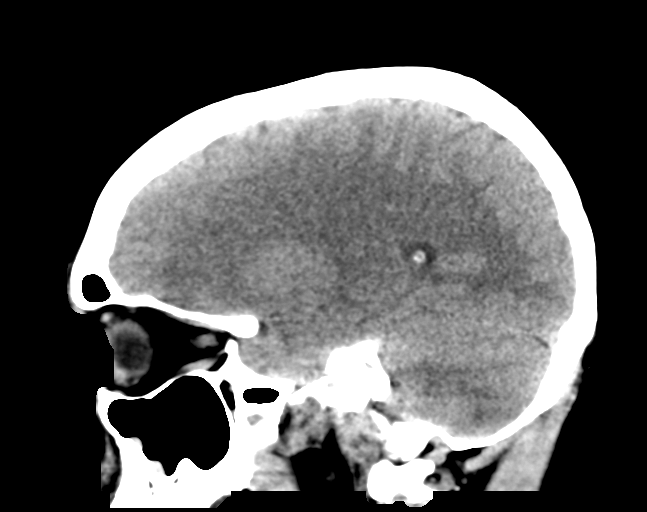

[17 of 47 positions shown; findings below may reference images not displayed]

FINDINGS: Brain: The ventricles are normal in size and configuration. No
extra-axial fluid collections are identified. The gray-white
differentiation is maintained. No CT findings for acute hemispheric
infarction or intracranial hemorrhage. No mass lesions. The
brainstem and cerebellum are normal.

Vascular: No hyperdense vessels or obvious aneurysm.

Skull: No acute skull fracture.  No bone lesion.

Sinuses/Orbits: The paranasal sinuses and mastoid air cells are
clear except for a few small mucous retention cysts or polyps in the
maxillary sinuses. The globes are intact.

Other: No scalp lesions, laceration or hematoma.
IMPRESSION: No acute intracranial findings.
